# Patient Record
Sex: Male | Born: 1960 | Race: White | Hispanic: No | Marital: Married | Smoking: Never smoker
Health system: Southern US, Community
[De-identification: ages and names within clinical notes are randomized; demographics above are authoritative.]

## PROBLEM LIST (undated history)

## (undated) DIAGNOSIS — I10 Essential (primary) hypertension: Secondary | ICD-10-CM

## (undated) DIAGNOSIS — K922 Gastrointestinal hemorrhage, unspecified: Secondary | ICD-10-CM

## (undated) DIAGNOSIS — E800 Hereditary erythropoietic porphyria: Secondary | ICD-10-CM

## (undated) DIAGNOSIS — R21 Rash and other nonspecific skin eruption: Secondary | ICD-10-CM

## (undated) DIAGNOSIS — Z9289 Personal history of other medical treatment: Secondary | ICD-10-CM

## (undated) HISTORY — DX: Rash and other nonspecific skin eruption: R21

## (undated) HISTORY — DX: Hereditary erythropoietic porphyria: E80.0

## (undated) HISTORY — DX: Personal history of other medical treatment: Z92.89

## (undated) HISTORY — PX: OTHER SURGICAL HISTORY: SHX169

## (undated) HISTORY — DX: Gastrointestinal hemorrhage, unspecified: K92.2

## (undated) HISTORY — DX: Essential (primary) hypertension: I10

---

## 1998-11-03 DIAGNOSIS — E785 Hyperlipidemia, unspecified: Secondary | ICD-10-CM | POA: Insufficient documentation

## 2000-04-03 HISTORY — PX: VASECTOMY: SHX75

## 2001-01-01 DIAGNOSIS — D72819 Decreased white blood cell count, unspecified: Secondary | ICD-10-CM | POA: Insufficient documentation

## 2002-11-16 DIAGNOSIS — I10 Essential (primary) hypertension: Secondary | ICD-10-CM

## 2004-03-03 DIAGNOSIS — Z9289 Personal history of other medical treatment: Secondary | ICD-10-CM

## 2004-03-03 HISTORY — DX: Personal history of other medical treatment: Z92.89

## 2005-01-09 ENCOUNTER — Ambulatory Visit: Payer: Self-pay | Admitting: Family Medicine

## 2005-02-19 ENCOUNTER — Ambulatory Visit: Payer: Self-pay | Admitting: Unknown Physician Specialty

## 2005-06-20 ENCOUNTER — Ambulatory Visit: Payer: Self-pay | Admitting: Family Medicine

## 2005-06-24 ENCOUNTER — Ambulatory Visit: Payer: Self-pay | Admitting: Family Medicine

## 2007-01-20 ENCOUNTER — Ambulatory Visit: Payer: Self-pay | Admitting: Family Medicine

## 2007-01-20 LAB — CONVERTED CEMR LAB
ALT: 34 units/L (ref 0–40)
AST: 35 units/L (ref 0–37)
Albumin: 3.9 g/dL (ref 3.5–5.2)
Alkaline Phosphatase: 44 units/L (ref 39–117)
BUN: 22 mg/dL (ref 6–23)
Basophils Absolute: 0.1 10*3/uL (ref 0.0–0.1)
Basophils Relative: 1 % (ref 0.0–1.0)
Bilirubin, Direct: 0.1 mg/dL (ref 0.0–0.3)
CO2: 30 meq/L (ref 19–32)
Calcium: 9.1 mg/dL (ref 8.4–10.5)
Chloride: 107 meq/L (ref 96–112)
Cholesterol: 139 mg/dL (ref 0–200)
Creatinine, Ser: 1.1 mg/dL (ref 0.4–1.5)
Eosinophils Absolute: 0.3 10*3/uL (ref 0.0–0.6)
Eosinophils Relative: 5.4 % — ABNORMAL HIGH (ref 0.0–5.0)
GFR calc Af Amer: 94 mL/min
GFR calc non Af Amer: 78 mL/min
Glucose, Bld: 92 mg/dL (ref 70–99)
HCT: 40.3 % (ref 39.0–52.0)
HDL: 39.3 mg/dL (ref >39.0)
Hemoglobin: 13.8 g/dL (ref 13.0–17.0)
LDL Cholesterol: 88 mg/dL (ref 0–99)
Lymphocytes Relative: 30 % (ref 12.0–46.0)
MCHC: 34.2 g/dL (ref 30.0–36.0)
MCV: 84.3 fL (ref 78.0–100.0)
Monocytes Absolute: 0.4 10*3/uL (ref 0.2–0.7)
Monocytes Relative: 8.1 % (ref 3.0–11.0)
Neutro Abs: 2.8 10*3/uL (ref 1.4–7.7)
Neutrophils Relative %: 55.5 % (ref 43.0–77.0)
Platelets: 187 10*3/uL (ref 150–400)
Potassium: 4.2 meq/L (ref 3.5–5.1)
RBC: 4.78 M/uL (ref 4.22–5.81)
RDW: 14.1 % (ref 11.5–14.6)
Sodium: 142 meq/L (ref 135–145)
TSH: 2.07 microintl units/mL (ref 0.35–5.50)
Total Bilirubin: 0.7 mg/dL (ref 0.3–1.2)
Total CHOL/HDL Ratio: 3.5
Total Protein: 6.8 g/dL (ref 6.0–8.3)
Triglycerides: 57 mg/dL (ref 0–149)
VLDL: 11 mg/dL (ref 0–40)
WBC: 5.1 10*3/uL (ref 4.5–10.5)

## 2007-01-29 ENCOUNTER — Ambulatory Visit: Payer: Self-pay | Admitting: Family Medicine

## 2007-03-04 ENCOUNTER — Telehealth: Payer: Self-pay | Admitting: Family Medicine

## 2007-03-10 ENCOUNTER — Telehealth: Payer: Self-pay | Admitting: Family Medicine

## 2007-05-13 ENCOUNTER — Encounter (INDEPENDENT_AMBULATORY_CARE_PROVIDER_SITE_OTHER): Payer: Self-pay | Admitting: *Deleted

## 2007-05-26 ENCOUNTER — Ambulatory Visit: Payer: Self-pay | Admitting: Family Medicine

## 2007-09-27 ENCOUNTER — Encounter (INDEPENDENT_AMBULATORY_CARE_PROVIDER_SITE_OTHER): Payer: Self-pay | Admitting: *Deleted

## 2007-10-25 ENCOUNTER — Ambulatory Visit: Payer: Self-pay | Admitting: Family Medicine

## 2008-02-28 ENCOUNTER — Ambulatory Visit: Payer: Self-pay | Admitting: Family Medicine

## 2008-02-28 LAB — CONVERTED CEMR LAB
ALT: 17 units/L (ref 0–53)
AST: 21 units/L (ref 0–37)
Albumin: 3.6 g/dL (ref 3.5–5.2)
Alkaline Phosphatase: 41 units/L (ref 39–117)
BUN: 18 mg/dL (ref 6–23)
Basophils Absolute: 0 10*3/uL (ref 0.0–0.1)
Basophils Relative: 0.2 % (ref 0.0–1.0)
Bilirubin, Direct: 0.1 mg/dL (ref 0.0–0.3)
CO2: 30 meq/L (ref 19–32)
Calcium: 8.8 mg/dL (ref 8.4–10.5)
Chloride: 107 meq/L (ref 96–112)
Cholesterol: 118 mg/dL (ref 0–200)
Creatinine, Ser: 1 mg/dL (ref 0.4–1.5)
Eosinophils Absolute: 0.2 10*3/uL (ref 0.0–0.7)
Eosinophils Relative: 3.4 % (ref 0.0–5.0)
GFR calc Af Amer: 104 mL/min
GFR calc non Af Amer: 86 mL/min
Glucose, Bld: 90 mg/dL (ref 70–99)
HCT: 40.2 % (ref 39.0–52.0)
HDL: 32.8 mg/dL — ABNORMAL LOW (ref >39.0)
Hemoglobin: 13.3 g/dL (ref 13.0–17.0)
LDL Cholesterol: 73 mg/dL (ref 0–99)
Lymphocytes Relative: 27.1 % (ref 12.0–46.0)
MCHC: 33.1 g/dL (ref 30.0–36.0)
MCV: 85.9 fL (ref 78.0–100.0)
Monocytes Absolute: 0.4 10*3/uL (ref 0.1–1.0)
Monocytes Relative: 6.1 % (ref 3.0–12.0)
Neutro Abs: 3.9 10*3/uL (ref 1.4–7.7)
Neutrophils Relative %: 63.2 % (ref 43.0–77.0)
Platelets: 172 10*3/uL (ref 150–400)
Potassium: 4 meq/L (ref 3.5–5.1)
RBC: 4.68 M/uL (ref 4.22–5.81)
RDW: 14.3 % (ref 11.5–14.6)
Sodium: 141 meq/L (ref 135–145)
TSH: 1.82 microintl units/mL (ref 0.35–5.50)
Total Bilirubin: 0.8 mg/dL (ref 0.3–1.2)
Total CHOL/HDL Ratio: 3.6
Total Protein: 6.2 g/dL (ref 6.0–8.3)
Triglycerides: 61 mg/dL (ref 0–149)
VLDL: 12 mg/dL (ref 0–40)
WBC: 6.2 10*3/uL (ref 4.5–10.5)

## 2008-03-02 ENCOUNTER — Ambulatory Visit: Payer: Self-pay | Admitting: Family Medicine

## 2009-03-19 ENCOUNTER — Telehealth: Payer: Self-pay | Admitting: Family Medicine

## 2009-04-09 ENCOUNTER — Telehealth: Payer: Self-pay | Admitting: Family Medicine

## 2009-04-09 ENCOUNTER — Inpatient Hospital Stay: Payer: Self-pay | Admitting: Internal Medicine

## 2009-04-09 ENCOUNTER — Ambulatory Visit: Payer: Self-pay | Admitting: Family Medicine

## 2009-04-10 ENCOUNTER — Encounter: Payer: Self-pay | Admitting: Family Medicine

## 2009-04-10 LAB — HM COLONOSCOPY

## 2009-04-11 ENCOUNTER — Encounter: Payer: Self-pay | Admitting: Family Medicine

## 2009-04-17 DIAGNOSIS — D126 Benign neoplasm of colon, unspecified: Secondary | ICD-10-CM | POA: Insufficient documentation

## 2009-04-23 ENCOUNTER — Ambulatory Visit: Payer: Self-pay | Admitting: Family Medicine

## 2009-04-23 LAB — CONVERTED CEMR LAB
Basophils Absolute: 0.1 10*3/uL (ref 0.0–0.1)
Basophils Relative: 1.4 % (ref 0.0–3.0)
Eosinophils Absolute: 0.1 10*3/uL (ref 0.0–0.7)
Eosinophils Relative: 2.8 % (ref 0.0–5.0)
HCT: 35.3 % — ABNORMAL LOW (ref 39.0–52.0)
Hemoglobin: 12 g/dL — ABNORMAL LOW (ref 13.0–17.0)
Lymphocytes Relative: 20.8 % (ref 12.0–46.0)
Lymphs Abs: 1.1 10*3/uL (ref 0.7–4.0)
MCHC: 34.2 g/dL (ref 30.0–36.0)
MCV: 87.4 fL (ref 78.0–100.0)
Monocytes Absolute: 0.4 10*3/uL (ref 0.1–1.0)
Monocytes Relative: 8.2 % (ref 3.0–12.0)
Neutro Abs: 3.6 10*3/uL (ref 1.4–7.7)
Neutrophils Relative %: 66.8 % (ref 43.0–77.0)
Platelets: 205 10*3/uL (ref 150.0–400.0)
RBC: 4.04 M/uL — ABNORMAL LOW (ref 4.22–5.81)
RDW: 15.2 % — ABNORMAL HIGH (ref 11.5–14.6)
WBC: 5.3 10*3/uL (ref 4.5–10.5)

## 2009-07-12 ENCOUNTER — Encounter (INDEPENDENT_AMBULATORY_CARE_PROVIDER_SITE_OTHER): Payer: Self-pay | Admitting: *Deleted

## 2009-08-07 ENCOUNTER — Ambulatory Visit: Payer: Self-pay | Admitting: Family Medicine

## 2009-08-07 LAB — CONVERTED CEMR LAB
ALT: 18 units/L (ref 0–53)
AST: 25 units/L (ref 0–37)
Albumin: 4.3 g/dL (ref 3.5–5.2)
Alkaline Phosphatase: 45 units/L (ref 39–117)
BUN: 17 mg/dL (ref 6–23)
Basophils Absolute: 0.1 10*3/uL (ref 0.0–0.1)
Basophils Relative: 1.2 % (ref 0.0–3.0)
Bilirubin, Direct: 0 mg/dL (ref 0.0–0.3)
CO2: 29 meq/L (ref 19–32)
Calcium: 9 mg/dL (ref 8.4–10.5)
Chloride: 106 meq/L (ref 96–112)
Cholesterol: 144 mg/dL (ref 0–200)
Creatinine, Ser: 1.1 mg/dL (ref 0.4–1.5)
Eosinophils Absolute: 0.1 10*3/uL (ref 0.0–0.7)
Eosinophils Relative: 1.9 % (ref 0.0–5.0)
GFR calc non Af Amer: 76.62 mL/min (ref >60)
Glucose, Bld: 90 mg/dL (ref 70–99)
HCT: 40 % (ref 39.0–52.0)
HDL: 44.1 mg/dL (ref >39.00)
Hemoglobin: 13.8 g/dL (ref 13.0–17.0)
LDL Cholesterol: 90 mg/dL (ref 0–99)
Lymphocytes Relative: 24.6 % (ref 12.0–46.0)
Lymphs Abs: 1.3 10*3/uL (ref 0.7–4.0)
MCHC: 34.5 g/dL (ref 30.0–36.0)
MCV: 85.2 fL (ref 78.0–100.0)
Monocytes Absolute: 0.5 10*3/uL (ref 0.1–1.0)
Monocytes Relative: 9.5 % (ref 3.0–12.0)
Neutro Abs: 3.1 10*3/uL (ref 1.4–7.7)
Neutrophils Relative %: 62.8 % (ref 43.0–77.0)
Platelets: 157 10*3/uL (ref 150.0–400.0)
Potassium: 3.8 meq/L (ref 3.5–5.1)
RBC: 4.69 M/uL (ref 4.22–5.81)
RDW: 14.2 % (ref 11.5–14.6)
Sodium: 139 meq/L (ref 135–145)
TSH: 1.96 microintl units/mL (ref 0.35–5.50)
Total Bilirubin: 0.8 mg/dL (ref 0.3–1.2)
Total CHOL/HDL Ratio: 3
Total Protein: 7.2 g/dL (ref 6.0–8.3)
Triglycerides: 51 mg/dL (ref 0.0–149.0)
VLDL: 10.2 mg/dL (ref 0.0–40.0)
WBC: 5.1 10*3/uL (ref 4.5–10.5)

## 2009-08-09 ENCOUNTER — Ambulatory Visit: Payer: Self-pay | Admitting: Family Medicine

## 2009-08-09 DIAGNOSIS — J301 Allergic rhinitis due to pollen: Secondary | ICD-10-CM | POA: Insufficient documentation

## 2010-06-06 ENCOUNTER — Encounter (INDEPENDENT_AMBULATORY_CARE_PROVIDER_SITE_OTHER): Payer: Self-pay | Admitting: *Deleted

## 2010-09-19 ENCOUNTER — Ambulatory Visit: Payer: Self-pay | Admitting: Family Medicine

## 2010-10-02 ENCOUNTER — Ambulatory Visit: Payer: Self-pay | Admitting: Family Medicine

## 2010-11-21 ENCOUNTER — Ambulatory Visit
Admission: RE | Admit: 2010-11-21 | Discharge: 2010-11-21 | Payer: Self-pay | Source: Home / Self Care | Attending: Family Medicine | Admitting: Family Medicine

## 2010-11-21 ENCOUNTER — Other Ambulatory Visit: Payer: Self-pay | Admitting: Family Medicine

## 2010-11-21 LAB — BASIC METABOLIC PANEL
BUN: 16 mg/dL (ref 6–23)
CO2: 29 mEq/L (ref 19–32)
Calcium: 9.2 mg/dL (ref 8.4–10.5)
Chloride: 100 mEq/L (ref 96–112)
Creatinine, Ser: 1 mg/dL (ref 0.4–1.5)
GFR: 88.09 mL/min (ref >60.00)
Glucose, Bld: 90 mg/dL (ref 70–99)
Potassium: 3.6 mEq/L (ref 3.5–5.1)
Sodium: 135 mEq/L (ref 135–145)

## 2010-11-21 LAB — LIPID PANEL
Cholesterol: 160 mg/dL (ref 0–200)
HDL: 42.3 mg/dL (ref >39.00)
LDL Cholesterol: 102 mg/dL — ABNORMAL HIGH (ref 0–99)
Total CHOL/HDL Ratio: 4
Triglycerides: 78 mg/dL (ref 0.0–149.0)
VLDL: 15.6 mg/dL (ref 0.0–40.0)

## 2010-11-21 LAB — HEPATIC FUNCTION PANEL
ALT: 45 U/L (ref 0–53)
AST: 51 U/L — ABNORMAL HIGH (ref 0–37)
Albumin: 4.4 g/dL (ref 3.5–5.2)
Alkaline Phosphatase: 48 U/L (ref 39–117)
Bilirubin, Direct: 0 mg/dL (ref 0.0–0.3)
Total Bilirubin: 0.7 mg/dL (ref 0.3–1.2)
Total Protein: 7.2 g/dL (ref 6.0–8.3)

## 2010-11-21 LAB — CBC WITH DIFFERENTIAL/PLATELET
Basophils Absolute: 0 10*3/uL (ref 0.0–0.1)
Basophils Relative: 0.7 % (ref 0.0–3.0)
Eosinophils Absolute: 0.1 10*3/uL (ref 0.0–0.7)
Eosinophils Relative: 1.2 % (ref 0.0–5.0)
HCT: 40.8 % (ref 39.0–52.0)
Hemoglobin: 14 g/dL (ref 13.0–17.0)
Lymphocytes Relative: 22.1 % (ref 12.0–46.0)
Lymphs Abs: 1.2 10*3/uL (ref 0.7–4.0)
MCHC: 34.3 g/dL (ref 30.0–36.0)
MCV: 86.3 fl (ref 78.0–100.0)
Monocytes Absolute: 0.5 10*3/uL (ref 0.1–1.0)
Monocytes Relative: 9.3 % (ref 3.0–12.0)
Neutro Abs: 3.5 10*3/uL (ref 1.4–7.7)
Neutrophils Relative %: 66.7 % (ref 43.0–77.0)
Platelets: 171 10*3/uL (ref 150.0–400.0)
RBC: 4.73 Mil/uL (ref 4.22–5.81)
RDW: 15.1 % — ABNORMAL HIGH (ref 11.5–14.6)
WBC: 5.3 10*3/uL (ref 4.5–10.5)

## 2010-11-21 LAB — TSH: TSH: 2.36 u[IU]/mL (ref 0.35–5.50)

## 2010-11-28 ENCOUNTER — Ambulatory Visit
Admission: RE | Admit: 2010-11-28 | Discharge: 2010-11-28 | Payer: Self-pay | Source: Home / Self Care | Attending: Family Medicine | Admitting: Family Medicine

## 2010-12-04 ENCOUNTER — Ambulatory Visit: Admit: 2010-12-04 | Payer: Self-pay | Admitting: Family Medicine

## 2010-12-04 ENCOUNTER — Ambulatory Visit: Payer: Self-pay | Admitting: Family Medicine

## 2010-12-04 NOTE — Letter (Signed)
Summary: Nadara Eaton letter  [place] at [facility]  [address] [place], [place] [address]   Phone: [phone]  Fax: [phone]       06/06/2010 MRN: [id number]  TYLIQUE AULL [address] [place], [place]  [address]  Dear Mr. Sus[facility] [place] and [place] announce the retirement of Arta Silence, M.D., from full-time practice at the [facility] office effective May 02, 2010 and his plans of returning part-time.  It is important to Dr. Hetty Ely and to our practice that you understand that [facility] has seven physicians in our office for your health care needs.  We will continue to offer the same exceptional care that you have today.    Dr. Hetty Ely has spoken to many of you about his plans for retirement and returning part-time in the fall.   We will continue to work with you through the transition to schedule appointments for you in the office and meet the high standards that [place] is committed to.   Again, it is with great pleasure that we share the news that Dr. Hetty Ely will return to [facility] in October of 2011 with a reduced schedule.    If you have any questions, or would like to request an appointment with one of our physicians, please call us at [phone] and press the option for Scheduling an appointment.  We take pleasure in providing you with excellent patient care and look forward to seeing you at your next office visit.  Our [facility] Physicians are:  Tillman Abide, M.D. Laurita Quint, M.D. Roxy Manns, M.D. Kerby Nora, M.D. Hannah Beat, M.D. Ruthe Mannan, M.D. We proudly welcomed Raechel Ache, M.D. and Eustaquio Boyden, M.D. to the practice in July/August 2011.  Sincerely,  [place] [facility]

## 2010-12-04 NOTE — Assessment & Plan Note (Signed)
Summary: ROA FOR 2 WEEK FOLLOW-UP/JRR   Vital Signs:  Patient profile:   50 year old male Height:      70 inches Weight:      186 pounds BMI:     26.78 Temp:     96.7 degrees F oral Pulse rate:   76 / minute Pulse rhythm:   regular BP sitting:   132 / 90  (left arm) Cuff size:   regular  Vitals Entered By: Linde Gillis CMA Duncan Dull) (October 02, 2010 9:01 AM) CC: two week follow up   History of Present Illness: Doing well with slow uptitration on Amlodipine. He is now taking 2.5 daily. He has occas lightheadedness which he has had since before starting Amlodipine. He feels well and has no complaints. Is going to [facility] BB game tonite.  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 Apply To Area Max Once A Day, Once A Week If Poss. 2)  Metoprolol Succinate 100 Mg  Tb24 (Metoprolol Succinate) .Marland Kitchen.. 1 Tablet Daily By Mouth 3)  Clinical Study// Something Inserted in Stomach .... Done Friday// 4)  Flonase 50 Mcg/act Susp (Fluticasone Propionate) .... Use 2 Sprays in Each Notril Daily 5)  Amlodipine Besylate 2.5 Mg Tabs (Amlodipine Besylate) .... As Dir  Current Medications (verified): 1)  Clobetasol Propionate 0.05 %  Crea (Clobetasol Propionate) .... Apply To Area Max Once A Day, Once A Week If Poss. 2)  Flonase 50 Mcg/act Susp (Fluticasone Propionate) .... Use 2 Sprays in Each Notril Daily 3)  Amlodipine Besylate 2.5 Mg Tabs (Amlodipine Besylate) .... One Tab By Mouth Once Daily 4)  Amlodipine Besylate 5 Mg Tabs (Amlodipine  Besylate) .... One Tab By Mouth Daily.  Allergies (verified): No Known Drug Allergies  Physical Exam  General:  Well-developed,well-nourished,in no acute distress; alert,appropriate and cooperative throughout examination Head:  Normocephalic and atraumatic without obvious abnormalities. No apparent alopecia but mils male pattern  balding. Eyes:  Conjunctiva clear bilaterally. Minimal vertical nystagmus with lying down. Ears:  External ear exam shows no significant lesions or deformities.  Otoscopic examination reveals clear canals, tympanic membranes are intact bilaterally without bulging, retraction, inflammation or discharge. Hearing is grossly normal bilaterally. Nose:  External nasal examination shows no deformity or inflammation. Nasal mucosa are pink and moist without lesions or exudates. Mouth:  Oral mucosa and oropharynx without lesions or exudates.  Teeth in good repair. Neck:  No deformities, masses, or tenderness noted. Lungs:  Normal respiratory effort, chest expands symmetrically. Lungs are clear to auscultation, no crackles or wheezes. Heart:  Normal rate and regular rhythm. S1 and S2 normal without gallop, murmur, click, rub or other extra sounds.   Impression & Recommendations:  Problem # 1:  HYPERTENSION (ICD-401.9) Assessment Deteriorated Slightly high today but uptitrating. Cont slow titration weekly. Will get to 5mg  daily in approx 3 weeks. The following medications were removed from the medication list:    Metoprolol Succinate 100 Mg Tb24 (Metoprolol succinate) .Marland Kitchen... 1 tablet daily by mouth    Amlodipine Besylate 2.5 Mg Tabs (Amlodipine besylate) .Marland Kitchen... As dir His updated medication  list for this problem includes:    Amlodipine Besylate 2.5 Mg Tabs (Amlodipine besylate) ..... One tab by mouth once daily    Amlodipine Besylate 5 Mg Tabs (Amlodipine besylate) ..... One tab by mouth daily.  BP today: 132/90 Prior BP: 118/78 (09/19/2010)  Labs Reviewed: K+: 3.8  (08/07/2009) Creat: : 1.1 (08/07/2009)   Chol: 144 (08/07/2009)   HDL: 44.10 (08/07/2009)   LDL: 90 (08/07/2009)   TG: 51.0 (08/07/2009)  Complete Medication List: 1)  Clobetasol Propionate 0.05 % Crea (Clobetasol propionate) .... Apply to area max once a day, once a week if poss. 2)  Flonase 50 Mcg/act Susp (Fluticasone propionate) .... Use 2 sprays in each notril daily 3)  Amlodipine Besylate 2.5 Mg Tabs (Amlodipine besylate) .... One tab by mouth once daily 4)  Amlodipine Besylate 5 Mg Tabs (Amlodipine besylate) .... One tab by mouth daily.  Patient Instructions: 1)  RTC 9 weeks for BP check. 2)  Enjoy [employer] BB game tonite. Prescriptions: AMLODIPINE BESYLATE 5 MG TABS (AMLODIPINE BESYLATE) one tab by mouth daily.  #30 x 12   Entered and Authorized by:   Shaune Leeks MD   Signed by:   Shaune Leeks MD on 10/02/2010   Method used:   Print then Give to Patient   RxID:   [id number]    Orders Added: 1)  Est. Patient Level III [56213]     Current Allergies (reviewed today): No known allergies

## 2010-12-04 NOTE — Assessment & Plan Note (Signed)
Summary: DISCUSS MEDICATION/CLE   Vital Signs:  Patient profile:   50 year old male Weight:      186.50 pounds BMI:     26.86 Temp:     97.9 degrees F oral Pulse rate:   64 / minute Pulse rhythm:   regular BP sitting:   118 / 78  (left arm) Cuff size:   regular  Vitals Entered By: Sydell Axon LPN (September 19, 2010 3:12 PM) CC: Needs to take allergy shots and wants to discuss changing BP medication    History of Present Illness: Pt here for allergies. He is having bad allergy problems this Fall. He was tested via bloodwork for allergies and very allergic to cats and trees, grass, dustmites, etc. Dr Mikey Bussing  office (where he has been  seen and tested) is telling him he should not be on a beta blocker while getting allergy immunotherapy. He wants to start immun tx again (he had been on it years ago) and it helped then which he is hoping it will do now.   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 Spray 2 Spray As Directed Once A Day As Needed 2)  Clobetasol Propionate 0.05 %  Crea (Clobetasol Propionate) .... Apply To Area Max Once A Day, Once A Week If Poss. 3)  Metoprolol Succinate 100 Mg  Tb24 (Metoprolol Succinate) .Marland Kitchen.. 1 Tablet Daily By Mouth 4)  Clinical Study// Something Inserted in Stomach .... Done Friday// 5)  Anusol-Hc 25 Mg Supp (Hydrocortisone Acetate) .... One Supp Per Rectum Three Times A Day For One Week, Then As Needed.  Allergies: No Known Drug Allergies  Physical Exam  General:  Well-developed,well-nourished,in  no acute distress; alert,appropriate and cooperative throughout examination Head:  Normocephalic and atraumatic without obvious abnormalities. No apparent alopecia but mils male pattern  balding. Eyes:  Conjunctiva clear bilaterally. Minimal vertical nystagmus with lying down. Ears:  External ear exam shows no significant lesions or deformities.  Otoscopic examination reveals clear canals, tympanic membranes are intact bilaterally without bulging, retraction, inflammation or discharge. Hearing is grossly normal bilaterally. Nose:  External nasal examination shows no deformity or inflammation. Nasal mucosa are pink and moist without lesions or exudates. Mouth:  Oral mucosa and oropharynx without lesions or exudates.  Teeth in good repair. Neck:  No deformities, masses, or tenderness noted. Lungs:  Normal respiratory effort, chest expands symmetrically. Lungs are clear to auscultation, no crackles or wheezes. Heart:  Normal rate and regular rhythm. S1 and S2 normal without gallop, murmur, click, rub or other extra sounds.   Impression & Recommendations:  Problem # 1:  HYPERTENSION (ICD-401.9) Assessment Unchanged  Well controlled. He was tried on Norvasc when first started in BP medication 1/04 and changed to Toprol due to dizziness, which he had with the Toprol also but not as bad because he was tapered up more slowly. Will try Amlodipine again but at lower slower taper. Discussed gradual weaning off Metoprolol and gradual titration up with Amlodipine. His updated medication list for this problem includes:    Metoprolol Succinate 100 Mg Tb24 (Metoprolol succinate) .Marland KitchenMarland KitchenMarland KitchenMarland Kitchen  1 tablet daily by mouth    Amlodipine Besylate 2.5 Mg Tabs (Amlodipine besylate) .Marland Kitchen... As dir  BP today: 118/78 Prior BP: 114/78 (08/09/2009)  Labs Reviewed: K+: 3.8 (08/07/2009) Creat: : 1.1 (08/07/2009)   Chol: 144 (08/07/2009)   HDL: 44.10 (08/07/2009)   LDL: 90 (08/07/2009)   TG: 51.0 (08/07/2009)  Problem # 2:   ALLERGIC RHINITIS, SEASONAL (ICD-477.0) Assessment: Unchanged Starting allergy immunotherapy via Dr Jenne Campus in 2 weeks.  Complete Medication List: 1)  Clobetasol Propionate 0.05 % Crea (Clobetasol propionate) .... Apply to area max once a day, once a week if poss. 2)  Metoprolol Succinate 100 Mg Tb24 (Metoprolol succinate) .Marland Kitchen.. 1 tablet daily by mouth 3)  Clinical Study// Something Inserted in Stomach  .... Done friday// 4)  Flonase 50 Mcg/act Susp (Fluticasone propionate) .... Use 2 sprays in each notril daily 5)  Amlodipine Besylate 2.5 Mg Tabs (Amlodipine besylate) .... As dir  Patient Instructions: 1)  RTC 2 weeks for recheck. 2)  Pls sched pt for Comp Exam, labs prior when able. Prescriptions: AMLODIPINE BESYLATE 2.5 MG TABS (AMLODIPINE BESYLATE) as dir  #30 x 12   Entered and Authorized by:   Shaune Leeks MD   Signed by:   Shaune Leeks MD on 09/19/2010   Method used:   Electronically to        [facility]* (retail)       [address]       [place], [place]  [address]       Ph: [phone]       Fax: [phone]   RxID:   [id number]    Orders Added: 1)  Est. Patient Level III [03474]    Current Allergies (reviewed today): No known allergies

## 2010-12-05 NOTE — Assessment & Plan Note (Signed)
Summary: CPX/JRR   Vital Signs:  Patient profile:   50 year old male Weight:      181 pounds Temp:     97.8 degrees F oral Pulse rate:   67 / minute Pulse rhythm:   regular BP sitting:   125 / 80  (left arm) Cuff size:   large  Vitals Entered By: Mervin Hack CMA Duncan Dull) (November 28, 2010 8:37 AM) CC: adult physical   History of Present Illness: Pt here for Comp Exam. He is doing well and tolerating the Amlodipine well. He has not had bad dizziness since adjusting meds.  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 One Tab By Mouth Daily. 2)  Clobetasol Propionate 0.05 %  Crea (Clobetasol Propionate) .... Apply To Area Max Once A Day, Once A Week If Poss. 3)  Amlodipine Besylate 2.5 Mg Tabs (Amlodipine Besylate) .... One Tab By Mouth Once Daily 4)  Flonase 50 Mcg/act Susp (Fluticasone Propionate) .... Use 2 Sprays in Each Notril Daily  Allergies: No Known Drug Allergies  Past History:  Past Medical History: Last updated: 01/31/2007 Hypertension (11/16/2002)  Past Surgical History: Last updated: 08/09/2009 Chilton Si Tobacco Syndrome (dizziness, dehydration) Vasectomy Artis Flock) 04/03/2000 MRI head nml Jenne Campus) 03/2004 HOSP  [facility]) Anemia due to Blood Loss Hypokalemia Erythr Prophyria GI Bleed from Polyp 6/7-04/11/2009 Colonoscopy 4mm Polyp Transv  Polypectomy  Sm Int Hemms (Dr Mechele Collin) 04/10/2009    3-5 yrs  Family History: Last updated: 11/28/2010 Father A 1 Afib Mother A 46 Sister A 18 Sister A 48  Social History: Last updated: 03/02/2008 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 Eyes:  Denies blurring, discharge, and eye pain. ENT:  Denies decreased hearing, ear discharge, earache, and ringing in ears. CV:  Denies chest pain or discomfort, fainting, fatigue, palpitations, shortness of breath with exertion, swelling of feet, and swelling of hands. Resp:  Denies cough, shortness of breath, and wheezing. GI:  Denies abdominal pain, bloody stools, change in bowel habits, constipation, dark tarry stools, diarrhea, indigestion, loss of appetite, nausea, vomiting, vomiting blood, and yellowish skin color. GU:  Denies discharge, dysuria, nocturia, and urinary frequency. MS:  Denies joint pain, low back pain, muscle aches, cramps, and stiffness. Derm:  Denies dryness, itching, and rash. Neuro:  Denies numbness, poor balance, tingling, and tremors.  Physical Exam  General:  Well-developed,well-nourished,in no acute distress; alert,appropriate and cooperative throughout examination Head:  Normocephalic and atraumatic without obvious abnormalities. No apparent alopecia but mild  male pattern  balding at the crown. Eyes:  Conjunctiva clear bilaterally. Minimal vertical nystagmus with lying down. Ears:  External ear exam shows  no significant lesions or deformities.  Otoscopic examination reveals clear canals, tympanic membranes are intact bilaterally without bulging, retraction, inflammation or discharge. Hearing is grossly normal bilaterally. Nose:  External nasal examination shows no deformity or inflammation. Nasal mucosa are pink and moist without lesions or exudates. Mouth:  Oral mucosa and oropharynx without lesions or exudates.  Teeth in good repair. Neck:  No deformities, masses, or tenderness noted. Chest Wall:  No deformities, masses, tenderness or gynecomastia noted. Breasts:  No masses or gynecomastia noted Lungs:  Normal respiratory effort, chest expands symmetrically. Lungs are clear to auscultation, no crackles or wheezes. Heart:  Normal rate and regular rhythm. S1 and S2 normal without gallop, murmur, click, rub or other extra sounds. Abdomen:  Bowel sounds positive,abdomen soft and non-tender without masses, organomegaly or hernias noted. Rectal:  No external abnormalities noted. Normal sphincter tone. No rectal masses or tenderness. G neg. Genitalia:  Testes bilaterally descended without nodularity, tenderness or masses. No scrotal masses or lesions. No penis lesions or urethral discharge. Prostate:  Prostate gland firm and smooth, no enlargement, nodularity, tenderness, mass, asymmetry or induration. 10-20gms. Msk:  No deformity or scoliosis noted of thoracic or lumbar spine.   Pulses:  R and L carotid,radial,femoral,dorsalis pedis and posterior tibial pulses are full and equal bilaterally Extremities:  No clubbing, cyanosis, edema, or deformity noted with normal full range of motion of all joints.   Neurologic:  No cranial nerve deficits noted. Station and gait are normal. Sensory, motor and coordinative functions appear intact. Skin:  Intact without suspicious lesions or rashes, mild erythema of the scrotum. Cervical Nodes:  No lymphadenopathy noted Inguinal Nodes:  No significant adenopathy Psych:   Cognition and judgment appear intact. Alert and cooperative with normal attention span and concentration. No apparent delusions, illusions, hallucinations   Impression & Recommendations:  Problem # 1:  HEALTH MAINTENANCE EXAM (ICD-V70.0)  Reviewed preventive care protocols, scheduled due services, and updated immunizations. Tdap today.  Problem # 2:  ALLERGIC RHINITIS, SEASONAL (ICD-477.0) Assessment: Unchanged Now on allergy shots and doing well.  Problem # 3:  BENIGN PAROXYSMAL POSITIONAL VERTIGO (ICD-386.11) Assessment: Improved Essentially resolved.  Problem # 4:  TINEA CRURIS (ICD-110.3) Assessment: Unchanged Try changing Temovate to daily Eucerin.  Problem # 5:  LEUKOPENIA, CHRONIC (ICD-288.50) Assessment: Improved Normal today.  Problem # 6:  HYPERLIPIDEMIA, WITH LOW HDL (ICD-272.4) Assessment: Improved Cont curr diet and exercise. Labs Reviewed: SGOT: 51 (11/21/2010)   SGPT: 45 (11/21/2010)   HDL:42.30 (11/21/2010), 44.10 (08/07/2009)  LDL:102 (11/21/2010), 90 (04/54/0981)  Chol:160 (11/21/2010), 144 (08/07/2009)  Trig:78.0 (11/21/2010), 51.0 (08/07/2009)  Problem # 7:  HYPERTENSION (ICD-401.9) Assessment: Improved Now well controlled. Cont. The following medications were removed from the medication list:    Amlodipine Besylate 2.5 Mg Tabs (Amlodipine besylate) ..... One tab by mouth once daily His updated medication list for this problem includes:    Amlodipine Besylate 5 Mg Tabs (Amlodipine besylate) ..... One tab by mouth daily.  BP today: 125/80 Prior BP: 132/90 (10/02/2010)  Labs Reviewed: K+: 3.6 (11/21/2010) Creat: : 1.0 (11/21/2010)   Chol: 160 (11/21/2010)   HDL: 42.30 (11/21/2010)   LDL: 102 (11/21/2010)   TG: 78.0 (11/21/2010)  Complete Medication List: 1)  Amlodipine  Besylate 5 Mg Tabs (Amlodipine besylate) .... One tab by mouth daily. 2)  Clobetasol Propionate 0.05 % Crea (Clobetasol propionate) .... Apply to area max once a day, once a week if  poss. 3)  Flonase 50 Mcg/act Susp (Fluticasone propionate) .... Use 2 sprays in each notril daily  Patient Instructions: 1)  RTC as needed  2)  Tdap today.  3)  Try EUCERIN cream daily for rash.   Orders Added: 1)  Est. Patient 40-64 years [99396]    Current Allergies (reviewed today): No known allergies   Appended Document: CPX/JRR    Clinical Lists Changes  Orders: Added new Service order of Tdap => 33yrs IM (14782) - Signed Added new Service order of Admin 1st Vaccine (95621) - Signed Added new Service order of Admin 1st Vaccine [facility]) [id number]) - Signed Observations: Added new observation of TD BOOST VIS: 09/20/08 version given November 28, 2010. (11/28/2010 10:35) Added new observation of TD BOOSTERLO: [id number] (11/28/2010 10:35) Added new observation of TD BOOST EXP: 08/22/2012 (11/28/2010 10:35) Added new observation of TD BOOSTERBY: DeShannon Smith CMA (AAMA) (11/28/2010 10:35) Added new observation of TD BOOSTERRT: IM (11/28/2010 10:35) Added new observation of TDBOOSTERDSE: 0.5 ml (11/28/2010 10:35) Added new observation of TD BOOSTERMF: [employer] (11/28/2010 10:35) Added new observation of TD BOOST SIT: left deltoid (11/28/2010 10:35) Added new observation of TD BOOSTER: Tdap (11/28/2010 10:35)       Tetanus/Td Vaccine    Vaccine Type: Tdap    Site: left deltoid    Mfr: GlaxoSmithKline    Dose: 0.5 ml    Route: IM    Given by: Mervin Hack CMA (AAMA)    Exp. Date: 08/22/2012    Lot #: [id number]    VIS given: 09/20/08 version given November 28, 2010.

## 2011-10-22 ENCOUNTER — Other Ambulatory Visit: Payer: Self-pay | Admitting: Family Medicine

## 2011-11-06 ENCOUNTER — Encounter: Payer: Self-pay | Admitting: Family Medicine

## 2011-11-07 ENCOUNTER — Encounter: Payer: Self-pay | Admitting: Family Medicine

## 2011-11-07 ENCOUNTER — Ambulatory Visit (INDEPENDENT_AMBULATORY_CARE_PROVIDER_SITE_OTHER): Payer: BC Managed Care – PPO | Admitting: Family Medicine

## 2011-11-07 VITALS — BP 120/82 | HR 71 | Temp 97.6°F | Wt 186.0 lb

## 2011-11-07 DIAGNOSIS — Z23 Encounter for immunization: Secondary | ICD-10-CM

## 2011-11-07 DIAGNOSIS — D126 Benign neoplasm of colon, unspecified: Secondary | ICD-10-CM

## 2011-11-07 DIAGNOSIS — R21 Rash and other nonspecific skin eruption: Secondary | ICD-10-CM

## 2011-11-07 DIAGNOSIS — R42 Dizziness and giddiness: Secondary | ICD-10-CM

## 2011-11-07 DIAGNOSIS — I10 Essential (primary) hypertension: Secondary | ICD-10-CM

## 2011-11-07 MED ORDER — CLOBETASOL PROPIONATE 0.05 % EX CREA: TOPICAL_CREAM | CUTANEOUS | Status: DC

## 2011-11-07 MED ORDER — AMLODIPINE BESYLATE 5 MG PO TABS: 5.0000 mg | ORAL_TABLET | Freq: Every day | ORAL | Status: DC

## 2011-11-07 NOTE — Progress Notes (Signed)
Hypertension:    Using medication without problems or lightheadedness: yes Chest pain with exertion:no Edema:no Short of breath:no Average home BPs: not checked Due for labs.    Colonoscopy due 2015 per Dr. Mechele Collin.    Rash, controlled with prn clobetasol and otc creams. In gluteal crease. Longstanding.    On allergy shots per ENT.    H/o vertigo with prev ENT eval.  Worse laying down at night with certain head movements.  No othostatic sx.  Sx are usually brief.    Meds, vitals, and allergies reviewed.   PMH and SH reviewed  ROS: See HPI.  Otherwise negative.    GEN: nad, alert and oriented HEENT: mucous membranes moist, tm wnl NECK: supple w/o LA CV: rrr. PULM: ctab, no inc wob ABD: soft, +bs EXT: no edema SKIN: no acute rash except for faint area of irritation in the gluteal crease.   CN 2-12 wnl B, S/S/DTR wnl x4 but DHP positive

## 2011-11-07 NOTE — Patient Instructions (Addendum)
Keep exercising.  Come back for fasting labs.  You can get your results through our phone system.  Follow the instructions on the blue card. Take care.  Glad to see you.  I sent your meds to the pharmacy.

## 2011-11-09 DIAGNOSIS — R42 Dizziness and giddiness: Secondary | ICD-10-CM | POA: Insufficient documentation

## 2011-11-09 DIAGNOSIS — R21 Rash and other nonspecific skin eruption: Secondary | ICD-10-CM | POA: Insufficient documentation

## 2011-11-09 NOTE — Assessment & Plan Note (Signed)
Per Dr. Mechele Collin

## 2011-11-09 NOTE — Assessment & Plan Note (Signed)
Topical steroid caution, use med prn, and f/u prn.  He agrees.

## 2011-11-09 NOTE — Assessment & Plan Note (Signed)
Reproduced with DHP but normal exam o/w.  Longstanding.  D/w pt about extinction exercises and f/u prn.  He agrees.

## 2011-11-09 NOTE — Assessment & Plan Note (Signed)
Return for labs, no change in meds.

## 2011-11-11 ENCOUNTER — Other Ambulatory Visit (INDEPENDENT_AMBULATORY_CARE_PROVIDER_SITE_OTHER): Payer: BC Managed Care – PPO

## 2011-11-11 DIAGNOSIS — I10 Essential (primary) hypertension: Secondary | ICD-10-CM

## 2011-11-11 LAB — LIPID PANEL
Cholesterol: 163 mg/dL (ref 0–200)
Total CHOL/HDL Ratio: 3
Triglycerides: 58 mg/dL (ref 0.0–149.0)

## 2011-11-11 LAB — COMPREHENSIVE METABOLIC PANEL
ALT: 29 U/L (ref 0–53)
BUN: 18 mg/dL (ref 6–23)
CO2: 29 mEq/L (ref 19–32)
Calcium: 9.1 mg/dL (ref 8.4–10.5)
Chloride: 103 mEq/L (ref 96–112)
Creatinine, Ser: 1 mg/dL (ref 0.4–1.5)
GFR: 83.73 mL/min (ref >60.00)

## 2012-12-08 ENCOUNTER — Telehealth: Payer: Self-pay

## 2012-12-08 ENCOUNTER — Ambulatory Visit (INDEPENDENT_AMBULATORY_CARE_PROVIDER_SITE_OTHER): Payer: BC Managed Care – PPO | Admitting: Family Medicine

## 2012-12-08 ENCOUNTER — Encounter: Payer: Self-pay | Admitting: Family Medicine

## 2012-12-08 ENCOUNTER — Telehealth: Payer: Self-pay | Admitting: Family Medicine

## 2012-12-08 VITALS — BP 140/100 | HR 70 | Temp 98.3°F

## 2012-12-08 DIAGNOSIS — R509 Fever, unspecified: Secondary | ICD-10-CM | POA: Insufficient documentation

## 2012-12-08 NOTE — Telephone Encounter (Signed)
Patient Information:  Caller Name: Jorja Loa  Phone: [phone]  Patient: Douglas Mathews  Gender: Male  DOB: 1960/12/08  Age: 52 Years  PCP: Crawford Givens Clelia Croft) [facility])  Office Follow Up:  Does the office need to follow up with this patient?: No  Instructions For The Office: N/A  RN Note:  Per disposition ED now pt refused and request to be seen in the office today.  Contacted the office and spoke with Rena and once again advised pt to go to ED, pt refused.  Appt scheduled for 1600 today with Dr Para March per Artelia Laroche.  Advised pt to go to ED if symptoms worsen before his appt. time.  Pt verbalized understanding.  Symptoms  Reason For Call & Symptoms: Patient states he has fever,  chills,  bodyaches, cough, congestion. Pt reports a stiff without heache x1 week and has been taking ibuprofen every 6 hours without taking temperature x 3 days 12/06/12, last know temp 12/07/12 at 1600 102.3 oral. Temp now after 2 hours of motrin 100.7 oral.  Reviewed Health History In EMR: Yes  Reviewed Medications In EMR: Yes  Reviewed Allergies In EMR: Yes  Reviewed Surgeries / Procedures: Yes  Date of Onset of Symptoms: 12/06/2012  Treatments Tried: Ibuprofen 3- 200mg  tabs  Treatments Tried Worked: No  Guideline(s) Used:  Influenza - Seasonal  Disposition Per Guideline:   Go to ED Now  Reason For Disposition Reached:   Headache and stiff neck (can't touch chin to chest)  Advice Given:  N/A

## 2012-12-08 NOTE — Assessment & Plan Note (Signed)
He has a sore neck but not a true stiff neck (this is highly unlikely to be related to the fever).  No reason to suspect meningitis.  D/w pt.  Would treat that supportively with heat, is already improving.  Flu neg, likely nonflu viral process that should resolve with fluids, time, rest and antipyretics.  Fu prn.  Well appearing.   ddx d/w pt. He agrees.

## 2012-12-08 NOTE — Progress Notes (Signed)
Sx started just over 48 hours ago.  Fever Monday AM, up to 102.3.  Chills, aches, having to sleep under mult blankets.  Some dec in fever with motrin.  Last took tylenol at ~11 AM today.  No vomiting, no diarrhea.  No ST, no ear pain.   He had some antecedent neck tightness starting last week (also upper back tightness, R>L, likely related to exercise at the gym) and this is getting better. It predates the other sx by almost a week. This had happened prev and had prev self resolved.   Meds, vitals, and allergies reviewed.   ROS: See HPI.  Otherwise, noncontributory.  GEN: nad, alert and oriented HEENT: mucous membranes moist, tm wnl, nasal exam mildly stuffy, OP wnl NECK: supple w/o LA, normal ROM but he does have some paraspinal muscle tenderness R>L on the paracervical muscles and the medial portion of the shoulder girdle CV: rrr.  no murmur PULM: ctab, no inc wob ABD: soft, +bs EXT: no edema SKIN: no acute rash  Flu test neg

## 2012-12-08 NOTE — Telephone Encounter (Signed)
ED disposition is reasonable. He has been advised.

## 2012-12-08 NOTE — Patient Instructions (Addendum)
Take tylenol or ibuprofen, drink plenty of fluids and use a heating pad.  This should gradually improve.  Take care.

## 2012-12-08 NOTE — Telephone Encounter (Signed)
Douglas Mathews with [facility] said pt has stiff neck, cannot turn neck side to side, look up or touch chin to chest for 1 week; no h/a but fever 3 days; highest 12/07/12 was 102.3. Pt has chills, body aches,cough,congestion,feet hurt to walk and pt taking ibuprofen 600 mg q6h for 3 days. [facility] disposition is ED. Pt refuses to go to ED and wants to be seen in our office.Discussed with Blair,RN team lead and Advised Douglas Mathews pt should go to ED for evaluation. Pt still refuses and appt scheduled today 4 pm with Dr Para March. Douglas Mathews to advise pt if his condition changes or worsens pt must go to ED. Douglas Mathews voiced understanding and will advise pt.

## 2012-12-18 ENCOUNTER — Other Ambulatory Visit: Payer: Self-pay | Admitting: Family Medicine

## 2013-06-30 ENCOUNTER — Other Ambulatory Visit: Payer: Self-pay | Admitting: Family Medicine

## 2013-06-30 NOTE — Telephone Encounter (Signed)
Electronic refill request. Patient has not been seen since 11/2011 except for 1 acute visit.  Please advise.

## 2013-06-30 NOTE — Telephone Encounter (Signed)
Schedule lab visit ahead of BP check.  Thanks.  rx sent.

## 2013-07-01 NOTE — Telephone Encounter (Signed)
Patient advised. Transferred to [facility] to schedule CPE as requested per patient.

## 2013-08-25 ENCOUNTER — Other Ambulatory Visit: Payer: Self-pay | Admitting: Family Medicine

## 2013-08-25 DIAGNOSIS — E785 Hyperlipidemia, unspecified: Secondary | ICD-10-CM

## 2013-08-30 ENCOUNTER — Other Ambulatory Visit (INDEPENDENT_AMBULATORY_CARE_PROVIDER_SITE_OTHER): Payer: BC Managed Care – PPO

## 2013-08-30 DIAGNOSIS — E785 Hyperlipidemia, unspecified: Secondary | ICD-10-CM

## 2013-08-30 LAB — LIPID PANEL
HDL: 44.1 mg/dL (ref >39.00)
LDL Cholesterol: 90 mg/dL (ref 0–99)
Total CHOL/HDL Ratio: 3
VLDL: 14.8 mg/dL (ref 0.0–40.0)

## 2013-08-30 LAB — COMPREHENSIVE METABOLIC PANEL
ALT: 24 U/L (ref 0–53)
AST: 26 U/L (ref 0–37)
Calcium: 9.4 mg/dL (ref 8.4–10.5)
Chloride: 102 mEq/L (ref 96–112)
Creatinine, Ser: 1 mg/dL (ref 0.4–1.5)

## 2013-09-06 ENCOUNTER — Encounter: Payer: Self-pay | Admitting: Family Medicine

## 2013-09-06 ENCOUNTER — Ambulatory Visit (INDEPENDENT_AMBULATORY_CARE_PROVIDER_SITE_OTHER): Payer: BC Managed Care – PPO | Admitting: Family Medicine

## 2013-09-06 VITALS — BP 130/88 | HR 71 | Temp 97.8°F | Ht 70.0 in | Wt 177.8 lb

## 2013-09-06 DIAGNOSIS — Z23 Encounter for immunization: Secondary | ICD-10-CM

## 2013-09-06 DIAGNOSIS — R21 Rash and other nonspecific skin eruption: Secondary | ICD-10-CM

## 2013-09-06 DIAGNOSIS — I10 Essential (primary) hypertension: Secondary | ICD-10-CM

## 2013-09-06 DIAGNOSIS — Z0000 Encounter for general adult medical examination without abnormal findings: Secondary | ICD-10-CM

## 2013-09-06 MED ORDER — AMLODIPINE BESYLATE 5 MG PO TABS: ORAL_TABLET | ORAL | Status: DC

## 2013-09-06 MED ORDER — CLOBETASOL PROPIONATE 0.05 % EX CREA: TOPICAL_CREAM | CUTANEOUS | Status: DC

## 2013-09-06 NOTE — Assessment & Plan Note (Signed)
Routine anticipatory guidance given to patient.  See health maintenance. Tetanus 2012 Flu shot 2014 Prostate cancer screening and PSA options (with potential risks and benefits of testing vs not testing) were discussed along with recent recs/guidelines.  He declined testing PSA at this point. Colonoscopy 2014 per Dr. Mechele Collin.  Requesting records.   Diet and exercise.  Encouraged both.

## 2013-09-06 NOTE — Patient Instructions (Signed)
Take care.  Don't change your meds.  Recheck in about 1 year.  Glad to see you.

## 2013-09-06 NOTE — Assessment & Plan Note (Signed)
Continue prn topical steroid with cautions.  He agrees.  No sx currently.

## 2013-09-06 NOTE — Addendum Note (Signed)
Addended by: Annamarie Major on: 09/06/2013 10:32 AM   Modules accepted: Orders

## 2013-09-06 NOTE — Progress Notes (Signed)
CPE- See plan.  Routine anticipatory guidance given to patient.  See health maintenance. Tetanus 2012 Flu shot 2014 Prostate cancer screening and PSA options (with potential risks and benefits of testing vs not testing) were discussed along with recent recs/guidelines.  He declined testing PSA at this point. Colonoscopy 2014 per Dr. Mechele Collin.  Requesting records.   Diet and exercise.  Encouraged both.   Rash on buttock responding to clobetasol, needs refill, used periodically.    Hypertension:    Using medication without problems or lightheadedness: yes Chest pain with exertion:no Edema:no Short of breath:no Intentional weight loss  PMH and SH reviewed  Meds, vitals, and allergies reviewed.   ROS: See HPI.  Otherwise negative.    GEN: nad, alert and oriented HEENT: mucous membranes moist NECK: supple w/o LA CV: rrr. PULM: ctab, no inc wob ABD: soft, +bs EXT: no edema SKIN: no acute rash

## 2013-09-06 NOTE — Assessment & Plan Note (Signed)
Controlled, continue amlodipine.  Labs d/w pt along with diet and exercise.

## 2014-06-04 ENCOUNTER — Encounter: Payer: Self-pay | Admitting: Family Medicine

## 2014-06-07 DIAGNOSIS — K824 Cholesterolosis of gallbladder: Secondary | ICD-10-CM | POA: Insufficient documentation

## 2014-10-17 ENCOUNTER — Telehealth: Payer: Self-pay | Admitting: Family Medicine

## 2014-10-17 NOTE — Telephone Encounter (Signed)
Electronic refill request. Patient has not been seen in greater than 1 year with no upcoming appt scheduled.  According to refill protocol, please advise. Last Filled:     90 tablet 3RF on 09/06/2013

## 2014-10-17 NOTE — Telephone Encounter (Signed)
Sent, please schedule a CPE.  Thanks.   

## 2014-10-18 NOTE — Telephone Encounter (Signed)
Please call patient and schedule CPE.  

## 2014-10-18 NOTE — Telephone Encounter (Signed)
I spoke to patient and he scheduled lab on 11/09/14 and physical 11/16/14.

## 2014-11-05 ENCOUNTER — Other Ambulatory Visit: Payer: Self-pay | Admitting: Family Medicine

## 2014-11-05 DIAGNOSIS — I10 Essential (primary) hypertension: Secondary | ICD-10-CM

## 2014-11-09 ENCOUNTER — Other Ambulatory Visit (INDEPENDENT_AMBULATORY_CARE_PROVIDER_SITE_OTHER): Payer: BLUE CROSS/BLUE SHIELD

## 2014-11-09 DIAGNOSIS — I10 Essential (primary) hypertension: Secondary | ICD-10-CM

## 2014-11-09 LAB — BASIC METABOLIC PANEL
BUN: 15 mg/dL (ref 6–23)
CO2: 28 mEq/L (ref 19–32)
Calcium: 9.4 mg/dL (ref 8.4–10.5)
Chloride: 104 mEq/L (ref 96–112)
Creatinine, Ser: 1 mg/dL (ref 0.4–1.5)
GFR: 82.71 mL/min (ref >60.00)
Glucose, Bld: 90 mg/dL (ref 70–99)
POTASSIUM: 3.9 meq/L (ref 3.5–5.1)
SODIUM: 137 meq/L (ref 135–145)

## 2014-11-09 LAB — LIPID PANEL
CHOL/HDL RATIO: 4
CHOLESTEROL: 154 mg/dL (ref 0–200)
HDL: 43.2 mg/dL (ref >39.00)
LDL CALC: 96 mg/dL (ref 0–99)
NONHDL: 110.8
Triglycerides: 74 mg/dL (ref 0.0–149.0)
VLDL: 14.8 mg/dL (ref 0.0–40.0)

## 2014-11-16 ENCOUNTER — Encounter: Payer: Self-pay | Admitting: Family Medicine

## 2014-11-16 ENCOUNTER — Ambulatory Visit (INDEPENDENT_AMBULATORY_CARE_PROVIDER_SITE_OTHER): Payer: BLUE CROSS/BLUE SHIELD | Admitting: Family Medicine

## 2014-11-16 VITALS — BP 122/82 | HR 72 | Temp 98.4°F | Ht 70.0 in | Wt 184.8 lb

## 2014-11-16 DIAGNOSIS — Z23 Encounter for immunization: Secondary | ICD-10-CM

## 2014-11-16 DIAGNOSIS — Z7189 Other specified counseling: Secondary | ICD-10-CM

## 2014-11-16 DIAGNOSIS — I10 Essential (primary) hypertension: Secondary | ICD-10-CM

## 2014-11-16 DIAGNOSIS — Z0000 Encounter for general adult medical examination without abnormal findings: Secondary | ICD-10-CM

## 2014-11-16 MED ORDER — AMLODIPINE BESYLATE 5 MG PO TABS: 5.0000 mg | ORAL_TABLET | Freq: Every day | ORAL | Status: DC

## 2014-11-16 NOTE — Patient Instructions (Signed)
Take care. Glad to see you.  Try to get a little more exercise.  Recheck in about 1 year, sooner if needed.  Flu shot today.  Hep A/B shot today.  Call back about a nurse visit for 2nd Hep A/B shot in at least 1 month and and 3rd Hep A/B shot in at least 6 months.

## 2014-11-16 NOTE — Progress Notes (Signed)
Pre visit review using our clinic review tool, if applicable. No additional management support is needed unless otherwise documented below in the visit note.  CPE- See plan.  Routine anticipatory guidance given to patient.  See health maintenance. Tetanus 2012 Flu today.  PNA and shingles not due Colonoscopy 2014 Prostate cancer screening and PSA options (with potential risks and benefits of testing vs not testing) were discussed along with recent recs/guidelines.  He declined testing PSA at this point. Living will d/w pt.  Wife designated if patient were incapacitated. Diet and exercise.  D/w pt.  Doing well on both.    Hypertension:    Using medication without problems or lightheadedness: yes Chest pain with exertion:no Edema:no Short of breath:no  Liver clinic- Followed by [facility].  No jaundice, abd sx.  Never had had HAV/HBV vaccine.  Due.  D/w pt.   PMH and SH reviewed  Meds, vitals, and allergies reviewed.   ROS: See HPI.  Otherwise negative.    GEN: nad, alert and oriented HEENT: mucous membranes moist NECK: supple w/o LA CV: rrr. PULM: ctab, no inc wob ABD: soft, +bs EXT: no edema SKIN: no acute rash

## 2014-11-17 DIAGNOSIS — Z7189 Other specified counseling: Secondary | ICD-10-CM | POA: Insufficient documentation

## 2014-11-17 NOTE — Assessment & Plan Note (Signed)
Per [facility] but needs HAV/HBV vaccine, will start series today, d/w pt.

## 2014-11-17 NOTE — Assessment & Plan Note (Signed)
Routine anticipatory guidance given to patient. See health maintenance.  Tetanus 2012  Flu today.  PNA and shingles not due  Colonoscopy 2014  Prostate cancer screening and PSA options (with potential risks and benefits of testing vs not testing) were discussed along with recent recs/guidelines. He declined testing PSA at this point.  Living will d/w pt. Wife designated if patient were incapacitated.  Diet and exercise. D/w pt. Doing well on both.

## 2014-11-17 NOTE — Assessment & Plan Note (Signed)
Controlled, continue current med, labs d/w pt.  Continue work on diet and exercise.  He agrees.

## 2014-12-19 ENCOUNTER — Ambulatory Visit (INDEPENDENT_AMBULATORY_CARE_PROVIDER_SITE_OTHER): Payer: BLUE CROSS/BLUE SHIELD | Admitting: *Deleted

## 2014-12-19 DIAGNOSIS — Z23 Encounter for immunization: Secondary | ICD-10-CM

## 2015-04-03 ENCOUNTER — Telehealth: Payer: Self-pay

## 2015-04-03 MED ORDER — CIPROFLOXACIN HCL 500 MG PO TABS: 500.0000 mg | ORAL_TABLET | Freq: Two times a day (BID) | ORAL | Status: DC

## 2015-04-03 NOTE — Telephone Encounter (Signed)
Patient advised.

## 2015-04-03 NOTE — Telephone Encounter (Signed)
Sent. Thanks.   

## 2015-04-03 NOTE — Telephone Encounter (Signed)
Pt left v/m; pt leaving for [place] on 04/06/15 for one week; pt was recommended to contact PCP to get rx of Cipro incase has stomach issues while in [place]. Pt request cb when rx called in to [facility]. Last annual exam 11/16/14.

## 2015-05-17 ENCOUNTER — Ambulatory Visit (INDEPENDENT_AMBULATORY_CARE_PROVIDER_SITE_OTHER): Payer: BLUE CROSS/BLUE SHIELD

## 2015-05-17 DIAGNOSIS — Z23 Encounter for immunization: Secondary | ICD-10-CM | POA: Diagnosis not present

## 2015-06-12 DIAGNOSIS — E800 Hereditary erythropoietic porphyria: Secondary | ICD-10-CM | POA: Insufficient documentation

## 2015-11-12 ENCOUNTER — Other Ambulatory Visit: Payer: Self-pay | Admitting: Family Medicine

## 2015-11-12 DIAGNOSIS — I10 Essential (primary) hypertension: Secondary | ICD-10-CM

## 2015-11-15 ENCOUNTER — Other Ambulatory Visit (INDEPENDENT_AMBULATORY_CARE_PROVIDER_SITE_OTHER): Payer: BLUE CROSS/BLUE SHIELD

## 2015-11-15 DIAGNOSIS — I10 Essential (primary) hypertension: Secondary | ICD-10-CM | POA: Diagnosis not present

## 2015-11-15 LAB — LIPID PANEL
CHOL/HDL RATIO: 4
Cholesterol: 156 mg/dL (ref 0–200)
HDL: 44.3 mg/dL (ref >39.00)
LDL Cholesterol: 95 mg/dL (ref 0–99)
NonHDL: 111.86
Triglycerides: 84 mg/dL (ref 0.0–149.0)
VLDL: 16.8 mg/dL (ref 0.0–40.0)

## 2015-11-15 LAB — COMPREHENSIVE METABOLIC PANEL
ALT: 26 U/L (ref 0–53)
AST: 31 U/L (ref 0–37)
Albumin: 4.6 g/dL (ref 3.5–5.2)
Alkaline Phosphatase: 49 U/L (ref 39–117)
BUN: 14 mg/dL (ref 6–23)
CHLORIDE: 103 meq/L (ref 96–112)
CO2: 30 meq/L (ref 19–32)
Calcium: 9.9 mg/dL (ref 8.4–10.5)
Creatinine, Ser: 0.9 mg/dL (ref 0.40–1.50)
GFR: 94.11 mL/min (ref >60.00)
GLUCOSE: 93 mg/dL (ref 70–99)
POTASSIUM: 3.9 meq/L (ref 3.5–5.1)
SODIUM: 139 meq/L (ref 135–145)
TOTAL PROTEIN: 7.2 g/dL (ref 6.0–8.3)
Total Bilirubin: 0.5 mg/dL (ref 0.2–1.2)

## 2015-11-16 ENCOUNTER — Other Ambulatory Visit: Payer: BLUE CROSS/BLUE SHIELD

## 2015-11-20 ENCOUNTER — Encounter: Payer: Self-pay | Admitting: Family Medicine

## 2015-11-20 ENCOUNTER — Ambulatory Visit (INDEPENDENT_AMBULATORY_CARE_PROVIDER_SITE_OTHER): Payer: BLUE CROSS/BLUE SHIELD | Admitting: Family Medicine

## 2015-11-20 VITALS — BP 104/76 | HR 64 | Temp 97.8°F | Ht 70.0 in | Wt 180.8 lb

## 2015-11-20 DIAGNOSIS — Z125 Encounter for screening for malignant neoplasm of prostate: Secondary | ICD-10-CM | POA: Diagnosis not present

## 2015-11-20 DIAGNOSIS — Z23 Encounter for immunization: Secondary | ICD-10-CM | POA: Diagnosis not present

## 2015-11-20 DIAGNOSIS — R21 Rash and other nonspecific skin eruption: Secondary | ICD-10-CM

## 2015-11-20 DIAGNOSIS — Z119 Encounter for screening for infectious and parasitic diseases, unspecified: Secondary | ICD-10-CM

## 2015-11-20 DIAGNOSIS — I10 Essential (primary) hypertension: Secondary | ICD-10-CM

## 2015-11-20 DIAGNOSIS — Z0000 Encounter for general adult medical examination without abnormal findings: Secondary | ICD-10-CM

## 2015-11-20 DIAGNOSIS — R399 Unspecified symptoms and signs involving the genitourinary system: Secondary | ICD-10-CM

## 2015-11-20 LAB — PSA: PSA: 0.21 ng/mL (ref 0.10–4.00)

## 2015-11-20 MED ORDER — AMLODIPINE BESYLATE 5 MG PO TABS: 5.0000 mg | ORAL_TABLET | Freq: Every day | ORAL | Status: DC

## 2015-11-20 MED ORDER — CLOBETASOL PROPIONATE 0.05 % EX CREA: TOPICAL_CREAM | CUTANEOUS | Status: DC

## 2015-11-20 NOTE — Patient Instructions (Signed)
Go to the lab on the way out.  We'll contact you with your lab report. Take care.  Glad to see you.  

## 2015-11-20 NOTE — Progress Notes (Signed)
Pre visit review using our clinic review tool, if applicable. No additional management support is needed unless otherwise documented below in the visit note.  CPE- See plan.  Routine anticipatory guidance given to patient.  See health maintenance. Tetanus 2012 Flu today.  PNA and shingles not due Colonoscopy 2014 PSA screening d/w pt, re: pros and cons.  Urinary frequency.  Stream is still good, normal.  1-2x/night nocturia.  Not bothersome enough to treat but is reasonable to check PSA.  D/w pt.   Living will d/w pt. Wife designated if patient were incapacitated.  Diet and exercise. D/w pt. Doing pretty well on both.  HCV neg 05/24/14 at [facility].  Opts in for HIV screening.   Hypertension:  Using medication without problems or lightheadedness: yes Chest pain with exertion:no Edema:no Short of breath:no  Liver clinic- Followed by [id number]. No jaundice, abd sx. prev had HAV/HBV vaccine.  Needs refill on steroid for chronic rash.  Used episodically w/o ADE.    PMH and SH reviewed  Meds, vitals, and allergies reviewed.   ROS: See HPI.  Otherwise negative.    GEN: nad, alert and oriented HEENT: mucous membranes moist NECK: supple w/o LA CV: rrr. PULM: ctab, no inc wob ABD: soft, +bs EXT: no edema SKIN: no acute rash Prostate gland firm and smooth, no enlargement, nodularity, tenderness, mass, asymmetry or induration.

## 2015-11-21 DIAGNOSIS — R399 Unspecified symptoms and signs involving the genitourinary system: Secondary | ICD-10-CM | POA: Insufficient documentation

## 2015-11-21 LAB — HIV ANTIBODY (ROUTINE TESTING W REFLEX): HIV: NONREACTIVE

## 2015-11-21 NOTE — Assessment & Plan Note (Signed)
Continue prn steroid application.  No ADE.  Doing well.

## 2015-11-21 NOTE — Assessment & Plan Note (Signed)
Controlled, continue as is.  D/w pt about diet and exercise.  

## 2015-11-21 NOTE — Assessment & Plan Note (Signed)
Per [id number].  Will send labs to them as FYI.

## 2015-11-21 NOTE — Assessment & Plan Note (Signed)
Tetanus 2012  Flu today.  PNA and shingles not due  Colonoscopy 2014  PSA screening d/w pt, re: pros and cons. Urinary frequency. Stream is still good, normal. 1-2x/night nocturia. Not bothersome enough to treat but is reasonable to check PSA. D/w pt.  Living will d/w pt. Wife designated if patient were incapacitated.  Diet and exercise. D/w pt. Doing pretty well on both.  HCV neg 05/24/14 at [facility].  Opts in for HIV screening.

## 2015-11-21 NOTE — Assessment & Plan Note (Signed)
PSA screening d/w pt, re: pros and cons. Urinary frequency. Stream is still good, normal. 1-2x/night nocturia. Not bothersome enough to treat but is reasonable to check PSA. D/w pt.

## 2016-11-03 ENCOUNTER — Other Ambulatory Visit: Payer: Self-pay | Admitting: Family Medicine

## 2016-11-03 DIAGNOSIS — Z125 Encounter for screening for malignant neoplasm of prostate: Secondary | ICD-10-CM

## 2016-11-03 DIAGNOSIS — I10 Essential (primary) hypertension: Secondary | ICD-10-CM

## 2016-11-06 ENCOUNTER — Telehealth: Payer: Self-pay

## 2016-11-06 MED ORDER — PROMETHAZINE HCL 25 MG RE SUPP: 25.0000 mg | Freq: Four times a day (QID) | RECTAL | 0 refills | Status: DC | PRN

## 2016-11-06 NOTE — Telephone Encounter (Addendum)
Sent but if progressive sx, if dehydrated, not making urine etc, then needs ER eval.  Thanks.

## 2016-11-06 NOTE — Addendum Note (Signed)
Addended by: Tonia Ghent on: 11/06/2016 04:11 PM   Modules accepted: Orders

## 2016-11-06 NOTE — Telephone Encounter (Signed)
Please advise, thanks.

## 2016-11-06 NOTE — Telephone Encounter (Signed)
Pt said he urinated about 30 minutes ago, and he has a fever/chills of 101 since yesterday. He said he will try the suppository and if it doesn't help he will go to the ER. He also has an appt tomorrow.

## 2016-11-06 NOTE — Telephone Encounter (Signed)
Pt called states that he's been vomiting since 12am, he vomits every 30 minutes. He has a HA ans is very dehydrated.  He said he tired  Drinking pepsi but he can't keep anything down. He's requesting a rx for suppositoires he said this is the only thing that has helped him in the past.

## 2016-11-07 ENCOUNTER — Encounter: Payer: Self-pay | Admitting: Family Medicine

## 2016-11-07 ENCOUNTER — Ambulatory Visit (INDEPENDENT_AMBULATORY_CARE_PROVIDER_SITE_OTHER): Payer: Managed Care, Other (non HMO) | Admitting: Family Medicine

## 2016-11-07 VITALS — BP 110/72 | HR 99 | Temp 98.8°F | Wt 181.2 lb

## 2016-11-07 DIAGNOSIS — J111 Influenza due to unidentified influenza virus with other respiratory manifestations: Secondary | ICD-10-CM | POA: Diagnosis not present

## 2016-11-07 MED ORDER — OSELTAMIVIR PHOSPHATE 75 MG PO CAPS: 75.0000 mg | ORAL_CAPSULE | Freq: Two times a day (BID) | ORAL | 0 refills | Status: DC

## 2016-11-07 MED ORDER — AMLODIPINE BESYLATE 5 MG PO TABS: 5.0000 mg | ORAL_TABLET | Freq: Every day | ORAL | 3 refills | Status: DC

## 2016-11-07 NOTE — Assessment & Plan Note (Signed)
Presumed flu.  Rest and fluids.  Start tamiflu.  Skip amlodipine until eating and drinking normally.  D/w pt.  Okay for outpatient f/u.  He agrees.

## 2016-11-07 NOTE — Progress Notes (Signed)
Sx started about 2 days ago.  Started to feel unwell, felt chills.  Fatigued, felt like he had a fever.  Temp 101.5 the day before yesterday.  Temp/fever improved with ibuprofen.  Taking otc allergy/cold meds in the meantime.  Early yesterday AM felt sick on his stomach, vomited mult times yesterday.  Was able to get phenergan suppositories.  Was able to urinate yesterday.  Suppositories did help some, he was able to get rx yesterday.    Sick contact: brother in law with the flu, with recent contact.  Pt hasn't had a flu shot.    Patient has muscle aching.    He and his father have a hx of recurrent vomiting with illnesses.    At this point, last UOP was about 1 hour ago.  Not lightheaded but feels weak.  Still with cough, still with muscle aches.  Not SOB.  + flatus.    Meds, vitals, and allergies reviewed.   ROS: Per HPI unless specifically indicated in ROS section   GEN: nad, alert and oriented HEENT: mucous membranes moist, tm w/o erythema, nasal exam w/o erythema, clear discharge noted,  OP with cobblestoning NECK: supple w/o LA CV: rrr.   PULM: ctab, no inc wob ABD: soft, not ttp, normal BS EXT: no edema

## 2016-11-07 NOTE — Patient Instructions (Signed)
Presumed flu.  Rest and fluids.  Start tamiflu.  Skip amlodipine until you are eating and drinking normally.  Physical when possible.  Take care.  Glad to see you.

## 2016-11-13 ENCOUNTER — Other Ambulatory Visit: Payer: BLUE CROSS/BLUE SHIELD

## 2016-11-20 ENCOUNTER — Encounter: Payer: BLUE CROSS/BLUE SHIELD | Admitting: Family Medicine

## 2017-04-06 ENCOUNTER — Other Ambulatory Visit: Payer: 59

## 2017-04-08 ENCOUNTER — Other Ambulatory Visit: Payer: 59

## 2017-04-09 ENCOUNTER — Other Ambulatory Visit (INDEPENDENT_AMBULATORY_CARE_PROVIDER_SITE_OTHER): Payer: 59

## 2017-04-09 DIAGNOSIS — I10 Essential (primary) hypertension: Secondary | ICD-10-CM | POA: Diagnosis not present

## 2017-04-09 DIAGNOSIS — Z125 Encounter for screening for malignant neoplasm of prostate: Secondary | ICD-10-CM

## 2017-04-09 LAB — COMPREHENSIVE METABOLIC PANEL
ALK PHOS: 49 U/L (ref 39–117)
ALT: 22 U/L (ref 0–53)
AST: 25 U/L (ref 0–37)
Albumin: 4.6 g/dL (ref 3.5–5.2)
BILIRUBIN TOTAL: 0.5 mg/dL (ref 0.2–1.2)
BUN: 14 mg/dL (ref 6–23)
CO2: 29 meq/L (ref 19–32)
CREATININE: 1.03 mg/dL (ref 0.40–1.50)
Calcium: 9.7 mg/dL (ref 8.4–10.5)
Chloride: 104 mEq/L (ref 96–112)
GFR: 80.11 mL/min (ref >60.00)
GLUCOSE: 90 mg/dL (ref 70–99)
Potassium: 3.9 mEq/L (ref 3.5–5.1)
Sodium: 140 mEq/L (ref 135–145)
TOTAL PROTEIN: 7.2 g/dL (ref 6.0–8.3)

## 2017-04-09 LAB — PSA: PSA: 0.27 ng/mL (ref 0.10–4.00)

## 2017-04-09 LAB — LIPID PANEL
CHOL/HDL RATIO: 4
Cholesterol: 160 mg/dL (ref 0–200)
HDL: 43.9 mg/dL (ref >39.00)
LDL Cholesterol: 103 mg/dL — ABNORMAL HIGH (ref 0–99)
NONHDL: 115.78
Triglycerides: 66 mg/dL (ref 0.0–149.0)
VLDL: 13.2 mg/dL (ref 0.0–40.0)

## 2017-04-10 ENCOUNTER — Encounter: Payer: Self-pay | Admitting: Family Medicine

## 2017-04-10 ENCOUNTER — Ambulatory Visit (INDEPENDENT_AMBULATORY_CARE_PROVIDER_SITE_OTHER): Payer: 59 | Admitting: Family Medicine

## 2017-04-10 VITALS — BP 106/72 | HR 62 | Temp 97.9°F | Ht 70.0 in | Wt 180.5 lb

## 2017-04-10 DIAGNOSIS — I10 Essential (primary) hypertension: Secondary | ICD-10-CM

## 2017-04-10 DIAGNOSIS — Z0000 Encounter for general adult medical examination without abnormal findings: Secondary | ICD-10-CM | POA: Diagnosis not present

## 2017-04-10 MED ORDER — AMLODIPINE BESYLATE 5 MG PO TABS: 5.0000 mg | ORAL_TABLET | Freq: Every day | ORAL | 3 refills | Status: DC

## 2017-04-10 NOTE — Progress Notes (Signed)
CPE- See plan.  Routine anticipatory guidance given to patient.  See health maintenance.  The possibility exists that previously documented standard health maintenance information may have been brought forward from a previous encounter into this note.  If needed, that same information has been updated to reflect the current situation based on today's encounter.    Tetanus 2012  Flu encouraged.   PNA and shingles not due  Colonoscopy 2014  PSA wnl.  Prostate cancer screening and PSA options (with potential risks and benefits of testing vs not testing) were discussed along with recent recs/guidelines.  He elects to keep testing PSA yearly.   Living will d/w pt. Wife designated if patient were incapacitated.  Diet and exercise. D/w pt. HCV neg [facility]/24/14 at [facility].  HIV screening prev done.   Hypertension:    Using medication without problems or lightheadedness:  yes Chest pain with exertion:no Edema:no Short of breath:no Labs d/w pt. Lipids reasonable.  Porphyrin metabolism disorder d/w pt.  He can f/u with [facility].  He limits sun exposure in the meantime and that limits his itching/aches/swelling/cutaneous sx.    PMH and SH reviewed  Meds, vitals, and allergies reviewed.   ROS: Per HPI.  Unless specifically indicated otherwise in HPI, the patient denies:  General: fever. Eyes: acute vision changes ENT: sore throat Cardiovascular: chest pain Respiratory: SOB GI: vomiting GU: dysuria Musculoskeletal: acute back pain Derm: acute rash Neuro: acute motor dysfunction Psych: worsening mood Endocrine: polydipsia Heme: bleeding Allergy: hayfever  GEN: nad, alert and oriented HEENT: mucous membranes moist NECK: supple w/o LA CV: rrr. PULM: ctab, no inc wob ABD: soft, +bs EXT: no edema SKIN: no acute rash

## 2017-04-10 NOTE — Patient Instructions (Signed)
Take care.  Glad to see you.  Update me as needed.  Recheck next year.

## 2017-04-11 NOTE — Assessment & Plan Note (Signed)
Reasonable control. Continue as is. Update me as needed. Labs discussed with patient. He agrees.

## 2017-04-11 NOTE — Assessment & Plan Note (Signed)
Tetanus 2012  Flu encouraged.   PNA and shingles not due  Colonoscopy 2014  PSA wnl.  Prostate cancer screening and PSA options (with potential risks and benefits of testing vs not testing) were discussed along with recent recs/guidelines.  He elects to keep testing PSA yearly.   Living will d/w pt. Wife designated if patient were incapacitated.  Diet and exercise. D/w pt. HCV neg 05/24/14 at [facility].  HIV screening prev done.

## 2017-04-11 NOTE — Assessment & Plan Note (Signed)
He can call about follow-up at the [facility] clinic.

## 2017-09-22 ENCOUNTER — Ambulatory Visit: Payer: 59

## 2017-11-17 ENCOUNTER — Ambulatory Visit (INDEPENDENT_AMBULATORY_CARE_PROVIDER_SITE_OTHER): Payer: 59

## 2017-11-17 DIAGNOSIS — Z23 Encounter for immunization: Secondary | ICD-10-CM | POA: Diagnosis not present

## 2017-12-25 ENCOUNTER — Ambulatory Visit (INDEPENDENT_AMBULATORY_CARE_PROVIDER_SITE_OTHER): Payer: 59 | Admitting: Family Medicine

## 2017-12-25 ENCOUNTER — Encounter: Payer: Self-pay | Admitting: Family Medicine

## 2017-12-25 VITALS — BP 118/70 | HR 69 | Temp 97.9°F | Wt 174.5 lb

## 2017-12-25 DIAGNOSIS — K649 Unspecified hemorrhoids: Secondary | ICD-10-CM | POA: Insufficient documentation

## 2017-12-25 MED ORDER — HYDROCORTISONE 2.5 % RE CREA: 1.0000 "application " | TOPICAL_CREAM | Freq: Two times a day (BID) | RECTAL | 0 refills | Status: DC

## 2017-12-25 NOTE — Assessment & Plan Note (Signed)
Story/exam consistent with external hemorrhoid. Not acutely thrombosed. Anticipate resolution on its own as it is improving over last 24 hours - less pain. Will Rx anusol HC cream, sitz baths, further supportive care reviewed. Update if not improving with treatment. Pt agrees with plan.

## 2017-12-25 NOTE — Progress Notes (Signed)
   BP 118/70 (BP Location: Left Arm, Patient Position: Sitting, Cuff Size: Normal)   Pulse 69   Temp 97.9 F (36.6 C) (Oral)   Wt 174 lb 8 oz (79.2 kg)   SpO2 97%   BMI 25.04 kg/m    CC: rectal soreness Subjective:    Patient ID: Douglas Mathews, male    DOB: 08/19/1961, 57 y.o.   MRN: [id number]  HPI: Douglas Mathews is a 57 y.o. male presenting on 12/25/2017 for Rectal Pain (Noticed a painful blister in rectal area 2 days ago. Denies any bleeding.)   2d h/o soreness at back side - with noted bump. No bleeding. No pain with defecation. Actually today symptoms improved.  Has lost 10 lbs with healthy diet changes but increased bowel movements noted with this as well.   Relevant past medical, surgical, family and social history reviewed and updated as indicated.  Interim medical history since our last visit reviewed.  Allergies and medications reviewed and updated.  Outpatient Medications Prior to Visit  Medication Sig Dispense Refill  . amLODipine (NORVASC) 5 MG tablet Take 1 tablet (5 mg total) by mouth daily. 90 tablet 3  . clobetasol cream (TEMOVATE) 0.05 % Apply to area max once a day, once a week if possible 30 g 12  . fluticasone (FLONASE) 50 MCG/ACT nasal spray Place 2 sprays into the nose daily as needed.      No facility-administered medications prior to visit.      Per HPI unless specifically indicated in ROS section below Review of Systems     Objective:    BP 118/70 (BP Location: Left Arm, Patient Position: Sitting, Cuff Size: Normal)   Pulse 69   Temp 97.9 F (36.6 C) (Oral)   Wt 174 lb 8 oz (79.2 kg)   SpO2 97%   BMI 25.04 kg/m   Wt Readings from Last 3 Encounters:  12/25/17 174 lb 8 oz (79.2 kg)  04/10/17 180 lb 8 oz (81.9 kg)  11/07/16 181 lb 4 oz (82.2 kg)    Physical Exam  Constitutional: He appears well-developed and well-nourished. No distress.  Genitourinary: Rectal exam shows external hemorrhoid (enlarged R sided external hemorrhoid).    Nursing note and vitals reviewed.     Assessment & Plan:   Problem List Items Addressed This Visit    Acute hemorrhoid - Primary    Story/exam consistent with external hemorrhoid. Not acutely thrombosed. Anticipate resolution on its own as it is improving over last 24 hours - less pain. Will Rx anusol HC cream, sitz baths, further supportive care reviewed. Update if not improving with treatment. Pt agrees with plan.           Meds ordered this encounter  Medications  . hydrocortisone (ANUSOL-HC) 2.5 % rectal cream    Sig: Place 1 application rectally 2 (two) times daily.    Dispense:  30 g    Refill:  0   No orders of the defined types were placed in this encounter.   Follow up plan: Return if symptoms worsen or fail to improve.  Douglas Bush, MD

## 2017-12-25 NOTE — Patient Instructions (Signed)
You do have hemorrhoid Treat with anusol HC cream as needed and warm sitz baths.   Hemorrhoids Hemorrhoids are swollen veins in and around the rectum or anus. There are two types of hemorrhoids:  Internal hemorrhoids. These occur in the veins that are just inside the rectum. They may poke through to the outside and become irritated and painful.  External hemorrhoids. These occur in the veins that are outside of the anus and can be felt as a painful swelling or hard lump near the anus.  Most hemorrhoids do not cause serious problems, and they can be managed with home treatments such as diet and lifestyle changes. If home treatments do not help your symptoms, procedures can be done to shrink or remove the hemorrhoids. What are the causes? This condition is caused by increased pressure in the anal area. This pressure may result from various things, including:  Constipation.  Straining to have a bowel movement.  Diarrhea.  Pregnancy.  Obesity.  Sitting for long periods of time.  Heavy lifting or other activity that causes you to strain.  Anal sex.  What are the signs or symptoms? Symptoms of this condition include:  Pain.  Anal itching or irritation.  Rectal bleeding.  Leakage of stool (feces).  Anal swelling.  One or more lumps around the anus.  How is this diagnosed? This condition can often be diagnosed through a visual exam. Other exams or tests may also be done, such as:  Examination of the rectal area with a gloved hand (digital rectal exam).  Examination of the anal canal using a small tube (anoscope).  A blood test, if you have lost a significant amount of blood.  A test to look inside the colon (sigmoidoscopy or colonoscopy).  How is this treated? This condition can usually be treated at home. However, various procedures may be done if dietary changes, lifestyle changes, and other home treatments do not help your symptoms. These procedures can help make  the hemorrhoids smaller or remove them completely. Some of these procedures involve surgery, and others do not. Common procedures include:  Rubber band ligation. Rubber bands are placed at the base of the hemorrhoids to cut off the blood supply to them.  Sclerotherapy. Medicine is injected into the hemorrhoids to shrink them.  Infrared coagulation. A type of light energy is used to get rid of the hemorrhoids.  Hemorrhoidectomy surgery. The hemorrhoids are surgically removed, and the veins that supply them are tied off.  Stapled hemorrhoidopexy surgery. A circular stapling device is used to remove the hemorrhoids and use staples to cut off the blood supply to them.  Follow these instructions at home: Eating and drinking  Eat foods that have a lot of fiber in them, such as whole grains, beans, nuts, fruits, and vegetables. Ask your health care provider about taking products that have added fiber (fiber supplements).  Drink enough fluid to keep your urine clear or pale yellow. Managing pain and swelling  Take warm sitz baths for 20 minutes, 3-4 times a day to ease pain and discomfort.  If directed, apply ice to the affected area. Using ice packs between sitz baths may be helpful. ? Put ice in a plastic bag. ? Place a towel between your skin and the bag. ? Leave the ice on for 20 minutes, 2-3 times a day. General instructions  Take over-the-counter and prescription medicines only as told by your health care provider.  Use medicated creams or suppositories as told.  Exercise regularly.  Go to the bathroom when you have the urge to have a bowel movement. Do not wait.  Avoid straining to have bowel movements.  Keep the anal area dry and clean. Use wet toilet paper or moist towelettes after a bowel movement.  Do not sit on the toilet for long periods of time. This increases blood pooling and pain. Contact a health care provider if:  You have increasing pain and swelling that are  not controlled by treatment or medicine.  You have uncontrolled bleeding.  You have difficulty having a bowel movement, or you are unable to have a bowel movement.  You have pain or inflammation outside the area of the hemorrhoids. This information is not intended to replace advice given to you by your health care provider. Make sure you discuss any questions you have with your health care provider. Document Released: 10/17/2000 Document Revised: 03/19/2016 Document Reviewed: 07/04/2015 Elsevier Interactive Patient Education  [employer].

## 2018-01-19 LAB — HM COLONOSCOPY

## 2018-05-17 ENCOUNTER — Observation Stay (HOSPITAL_COMMUNITY): Payer: No Typology Code available for payment source

## 2018-05-17 ENCOUNTER — Observation Stay (HOSPITAL_COMMUNITY)
Admission: EM | Admit: 2018-05-17 | Discharge: 2018-05-18 | Disposition: A | Discharge status: Home / Self Care | Payer: No Typology Code available for payment source | Attending: Internal Medicine | Admitting: Internal Medicine

## 2018-05-17 ENCOUNTER — Other Ambulatory Visit: Payer: Self-pay

## 2018-05-17 ENCOUNTER — Emergency Department (HOSPITAL_COMMUNITY): Payer: No Typology Code available for payment source

## 2018-05-17 ENCOUNTER — Encounter (HOSPITAL_COMMUNITY): Payer: Self-pay | Admitting: Emergency Medicine

## 2018-05-17 DIAGNOSIS — R531 Weakness: Principal | ICD-10-CM

## 2018-05-17 DIAGNOSIS — H538 Other visual disturbances: Secondary | ICD-10-CM | POA: Diagnosis not present

## 2018-05-17 DIAGNOSIS — I10 Essential (primary) hypertension: Secondary | ICD-10-CM | POA: Diagnosis not present

## 2018-05-17 DIAGNOSIS — R29810 Facial weakness: Secondary | ICD-10-CM | POA: Diagnosis not present

## 2018-05-17 DIAGNOSIS — Z79899 Other long term (current) drug therapy: Secondary | ICD-10-CM | POA: Diagnosis not present

## 2018-05-17 DIAGNOSIS — R2990 Unspecified symptoms and signs involving the nervous system: Secondary | ICD-10-CM

## 2018-05-17 DIAGNOSIS — R4781 Slurred speech: Secondary | ICD-10-CM | POA: Diagnosis not present

## 2018-05-17 DIAGNOSIS — M21371 Foot drop, right foot: Secondary | ICD-10-CM

## 2018-05-17 DIAGNOSIS — I729 Aneurysm of unspecified site: Secondary | ICD-10-CM

## 2018-05-17 LAB — URINALYSIS, ROUTINE W REFLEX MICROSCOPIC
BILIRUBIN URINE: NEGATIVE
GLUCOSE, UA: NEGATIVE mg/dL
Hgb urine dipstick: NEGATIVE
Ketones, ur: NEGATIVE mg/dL
Leukocytes, UA: NEGATIVE
NITRITE: NEGATIVE
PH: 5 (ref 5.0–8.0)
Protein, ur: NEGATIVE mg/dL
Specific Gravity, Urine: 1.005 (ref 1.005–1.030)

## 2018-05-17 LAB — COMPREHENSIVE METABOLIC PANEL
ALT: 19 U/L (ref 0–44)
ANION GAP: 9 (ref 5–15)
AST: 28 U/L (ref 15–41)
Albumin: 4.3 g/dL (ref 3.5–5.0)
Alkaline Phosphatase: 48 U/L (ref 38–126)
BUN: 15 mg/dL (ref 6–20)
CALCIUM: 9.3 mg/dL (ref 8.9–10.3)
CHLORIDE: 104 mmol/L (ref 98–111)
CO2: 26 mmol/L (ref 22–32)
Creatinine, Ser: 1.03 mg/dL (ref 0.61–1.24)
GFR calc non Af Amer: >60 mL/min (ref >60)
Glucose, Bld: 110 mg/dL — ABNORMAL HIGH (ref 70–99)
POTASSIUM: 3.6 mmol/L (ref 3.5–5.1)
SODIUM: 139 mmol/L (ref 135–145)
Total Bilirubin: 0.5 mg/dL (ref 0.3–1.2)
Total Protein: 7.3 g/dL (ref 6.5–8.1)

## 2018-05-17 LAB — CBC
HCT: 39.1 % (ref 39.0–52.0)
HEMOGLOBIN: 13 g/dL (ref 13.0–17.0)
MCH: 28.3 pg (ref 26.0–34.0)
MCHC: 33.2 g/dL (ref 30.0–36.0)
MCV: 85 fL (ref 78.0–100.0)
Platelets: 266 10*3/uL (ref 150–400)
RBC: 4.6 MIL/uL (ref 4.22–5.81)
RDW: 13.8 % (ref 11.5–15.5)
WBC: 8 10*3/uL (ref 4.0–10.5)

## 2018-05-17 LAB — RAPID URINE DRUG SCREEN, HOSP PERFORMED
AMPHETAMINES: NOT DETECTED
BENZODIAZEPINES: NOT DETECTED
COCAINE: NOT DETECTED
OPIATES: NOT DETECTED
Tetrahydrocannabinol: NOT DETECTED

## 2018-05-17 LAB — ETHANOL

## 2018-05-17 LAB — DIFFERENTIAL
Abs Immature Granulocytes: 0 10*3/uL (ref 0.0–0.1)
BASOS ABS: 0.1 10*3/uL (ref 0.0–0.1)
BASOS PCT: 1 %
EOS PCT: 3 %
Eosinophils Absolute: 0.2 10*3/uL (ref 0.0–0.7)
IMMATURE GRANULOCYTES: 0 %
Lymphocytes Relative: 23 %
Lymphs Abs: 1.9 10*3/uL (ref 0.7–4.0)
MONO ABS: 0.6 10*3/uL (ref 0.1–1.0)
Monocytes Relative: 7 %
NEUTROS PCT: 66 %
Neutro Abs: 5.3 10*3/uL (ref 1.7–7.7)

## 2018-05-17 LAB — I-STAT CHEM 8, ED
BUN: 17 mg/dL (ref 6–20)
CHLORIDE: 102 mmol/L (ref 98–111)
CREATININE: 1 mg/dL (ref 0.61–1.24)
Calcium, Ion: 1.21 mmol/L (ref 1.15–1.40)
GLUCOSE: 108 mg/dL — AB (ref 70–99)
HEMATOCRIT: 39 % (ref 39.0–52.0)
HEMOGLOBIN: 13.3 g/dL (ref 13.0–17.0)
POTASSIUM: 3.7 mmol/L (ref 3.5–5.1)
Sodium: 139 mmol/L (ref 135–145)
TCO2: 27 mmol/L (ref 22–32)

## 2018-05-17 LAB — I-STAT TROPONIN, ED: Troponin i, poc: 0 ng/mL (ref 0.00–0.08)

## 2018-05-17 LAB — PROTIME-INR
INR: 0.95
Prothrombin Time: 12.6 seconds (ref 11.4–15.2)

## 2018-05-17 LAB — APTT: APTT: 29 s (ref 24–36)

## 2018-05-17 MED ORDER — ASPIRIN EC 325 MG PO TBEC
650.0000 mg | DELAYED_RELEASE_TABLET | Freq: Once | ORAL | Status: AC
  Administered 2018-05-17: 650 mg via ORAL
  Filled 2018-05-17: qty 2

## 2018-05-17 MED ORDER — ACETAMINOPHEN 325 MG PO TABS: 650.0000 mg | ORAL_TABLET | ORAL | Status: DC | PRN

## 2018-05-17 MED ORDER — ASPIRIN 325 MG PO TABS
325.0000 mg | ORAL_TABLET | Freq: Every day | ORAL | Status: DC
  Administered 2018-05-18: 325 mg via ORAL
  Filled 2018-05-17: qty 1

## 2018-05-17 MED ORDER — ACETAMINOPHEN 160 MG/5ML PO SOLN: 650.0000 mg | ORAL | Status: DC | PRN

## 2018-05-17 MED ORDER — STROKE: EARLY STAGES OF RECOVERY BOOK
Freq: Once | Status: AC
  Administered 2018-05-18: 01:00:00
  Filled 2018-05-17: qty 1

## 2018-05-17 MED ORDER — ASPIRIN 300 MG RE SUPP: 300.0000 mg | Freq: Every day | RECTAL | Status: DC

## 2018-05-17 MED ORDER — ENOXAPARIN SODIUM 40 MG/0.4ML ~~LOC~~ SOLN
40.0000 mg | SUBCUTANEOUS | Status: DC
  Administered 2018-05-18: 40 mg via SUBCUTANEOUS
  Filled 2018-05-17: qty 0.4

## 2018-05-17 MED ORDER — ACETAMINOPHEN 650 MG RE SUPP: 650.0000 mg | RECTAL | Status: DC | PRN

## 2018-05-17 MED ORDER — SODIUM CHLORIDE 0.9 % IV SOLN
INTRAVENOUS | Status: AC
  Administered 2018-05-18: 01:00:00 via INTRAVENOUS

## 2018-05-17 MED ORDER — SENNOSIDES-DOCUSATE SODIUM 8.6-50 MG PO TABS
1.0000 | ORAL_TABLET | Freq: Every evening | ORAL | Status: DC | PRN
Start: 2018-05-17 — End: 2018-05-18

## 2018-05-17 NOTE — ED Notes (Signed)
Patient transported to MRI 

## 2018-05-17 NOTE — H&P (Signed)
History and Physical    Douglas Mathews:[id number] DOB: 1961/03/05 DOA: 05/17/2018  PCP: Tonia Ghent, MD   Patient coming from: Home  Chief Complaint: Blurred vision, right-sided weakness   HPI: Douglas Mathews is a 57 y.o. male with medical history significant for hypertension, now presenting to the emergency department for evaluation of right leg weakness and transient blurred vision. Patient reports that he has been in his usual state of health and was having an uneventful day until around noon when he noted some subtle problems with his right leg, described as a "heaviness." He reports that his sandal on his right foot kept falling off. He had an episode of blurred vision and that seemed to resolve, but the right distal leg weakness persisted. He denies any chest pain or palpitations, denies headache, and denies ever experiencing this previously. No recent fevers, chills, or shortness of breath. Denies back pain.   ED Course: Upon arrival to the ED, patient is found to be afebrile, saturating well on room air, and with vitals otherwise stable.  EKG features a sinus rhythm with sinus arrhythmia and PVCs. Noncontrast head CT is a normal study. Chemistry panel and CBC are unremarkable. UDS is negative, ethanol undetectable, and urinalysis unremarkable. Neurology was consulted by the ED physician and recommended medical admission for further evaluation and management of possible CVA. Patient was treated with 650 mg of aspirin in the ED, remains hemodynamically stable, and will be observed for ongoing evaluation and management.   Review of Systems:  All other systems reviewed and apart from HPI, are negative.  Past Medical History:  Diagnosis Date  . GI bleed    due to bleeding polyp  . History of MRI 03/2004   head, nml [name])  . Hypertension   . Rash    on buttock, respods to clobetasol    Past Surgical History:  Procedure Laterality Date  . [facility])   06/07-06/07/2009   Anemia due to blood loss hypokalemia Erythr prophyria GI bleed from polyp  . VASECTOMY  04/03/00   [facility])     reports that he has never smoked. He has never used smokeless tobacco. He reports that he drinks alcohol. He reports that he does not use drugs.  No Known Allergies  Family History  Problem Relation Age of Onset  . Heart disease Father        Afib  . Prostate cancer Neg Hx   . Colon cancer Neg Hx      Prior to Admission medications   Medication Sig Start Date End Date Taking? Authorizing Provider  amLODipine (NORVASC) 5 MG tablet Take 1 tablet (5 mg total) by mouth daily. 04/10/17   Tonia Ghent, MD  clobetasol cream (TEMOVATE) 0.05 % Apply to area max once a day, once a week if possible 11/20/15   Tonia Ghent, MD  fluticasone [facility]) 50 MCG/ACT nasal spray Place 2 sprays into the nose daily as needed.     [provider]  hydrocortisone (ANUSOL-HC) 2.5 % rectal cream Place 1 application rectally 2 (two) times daily. 12/25/17   Ria Bush, MD    Physical Exam: Vitals:   05/17/18 2100 05/17/18 2115 05/17/18 2130 05/17/18 2145  BP: 128/85 127/86 (!) 139/101 (!) 137/92  Pulse: 79 82 (!) 112 91  Resp: 18 20 (!) 26 16  Temp:      TempSrc:      SpO2: 97% 99% 100% 98%      Constitutional: NAD, calm  Eyes: PERTLA, lids and conjunctivae normal ENMT: Mucous membranes are moist. Posterior pharynx clear of any exudate or lesions.   Neck: normal, supple, no masses, no thyromegaly Respiratory: clear to auscultation bilaterally, no wheezing, no crackles. Normal respiratory effort.   Cardiovascular: S1 & S2 heard, regular rate and rhythm. No extremity edema.   Abdomen: No distension, no tenderness, soft. Bowel sounds normal.  Musculoskeletal: no clubbing / cyanosis. No joint deformity upper and lower extremities.   Skin: no significant rashes, lesions, ulcers. Warm, dry, well-perfused. Neurologic: CN 2-12 grossly intact. Sensation to  light touch intact. Strength 5/5 in bilaterally upper extremities, and in proximal lower extremities. Strength 3/5 in distal RLE.  Psychiatric: Alert and oriented x 3. Pleasant and cooperative.     Labs on Admission: I have personally reviewed following labs and imaging studies  CBC: Recent Labs  Lab 05/17/18 1957 05/17/18 2017  WBC 8.0  --   NEUTROABS 5.3  --   HGB 13.0 13.3  HCT 39.1 39.0  MCV 85.0  --   PLT 266  --    Basic Metabolic Panel: Recent Labs  Lab 05/17/18 1957 05/17/18 2017  NA 139 139  K 3.6 3.7  CL 104 102  CO2 26  --   GLUCOSE 110* 108*  BUN 15 17  CREATININE 1.03 1.00  CALCIUM 9.3  --    GFR: CrCl cannot be calculated (Unknown ideal weight.). Liver Function Tests: Recent Labs  Lab 05/17/18 1957  AST 28  ALT 19  ALKPHOS 48  BILITOT 0.5  PROT 7.3  ALBUMIN 4.3   No results for input(s): LIPASE, AMYLASE in the last 168 hours. No results for input(s): AMMONIA in the last 168 hours. Coagulation Profile: Recent Labs  Lab 05/17/18 1957  INR 0.95   Cardiac Enzymes: No results for input(s): CKTOTAL, CKMB, CKMBINDEX, TROPONINI in the last 168 hours. BNP (last 3 results) No results for input(s): PROBNP in the last 8760 hours. HbA1C: No results for input(s): HGBA1C in the last 72 hours. CBG: No results for input(s): GLUCAP in the last 168 hours. Lipid Profile: No results for input(s): CHOL, HDL, LDLCALC, TRIG, CHOLHDL, LDLDIRECT in the last 72 hours. Thyroid Function Tests: No results for input(s): TSH, T4TOTAL, FREET4, T3FREE, THYROIDAB in the last 72 hours. Anemia Panel: No results for input(s): VITAMINB12, FOLATE, FERRITIN, TIBC, IRON, RETICCTPCT in the last 72 hours. Urine analysis:    Component Value Date/Time   COLORURINE STRAW (A) 05/17/2018 2031   APPEARANCEUR CLEAR 05/17/2018 2031   LABSPEC 1.005 05/17/2018 2031   PHURINE 5.0 05/17/2018 2031   GLUCOSEU NEGATIVE 05/17/2018 2031   HGBUR NEGATIVE 05/17/2018 2031   BILIRUBINUR  NEGATIVE 05/17/2018 2031   [place] NEGATIVE 05/17/2018 2031   PROTEINUR NEGATIVE 05/17/2018 2031   NITRITE NEGATIVE 05/17/2018 2031   LEUKOCYTESUR NEGATIVE 05/17/2018 2031   Sepsis Labs: @LABRCNTIP (procalcitonin:4,lacticidven:4) )No results found for this or any previous visit (from the past 240 hour(s)).   Radiological Exams on Admission: Ct Head Code Stroke Wo Contrast  Result Date: 05/17/2018 CLINICAL DATA:  Code stroke. RIGHT-sided facial droop with weakness. Last known normal 8 hours ago. EXAM: CT HEAD WITHOUT CONTRAST TECHNIQUE: Contiguous axial images were obtained from the base of the skull through the vertex without intravenous contrast. COMPARISON:  None. FINDINGS: Brain: No evidence of acute infarction, hemorrhage, hydrocephalus, extra-axial collection or mass lesion/mass effect. Vascular: No hyperdense vessel or unexpected calcification. Skull: Normal. Negative for fracture or focal lesion. Sinuses/Orbits: No acute finding. Other: None. ASPECTS [facility] Stroke  Program Early CT Score) - Ganglionic level infarction (caudate, lentiform nuclei, internal capsule, insula, M1-M3 cortex): 7 - Supraganglionic infarction (M4-M6 cortex): 3 Total score (0-10 with 10 being normal): 10 IMPRESSION: 1. Normal CT head. 2. ASPECTS is 10. These results were communicated to Dr. Cheral Marker at [place] 7/15/2019by text page via the [facility] messaging system. Electronically Signed   By: Staci Righter M.D.   On: 05/17/2018 20:13    EKG: Independently reviewed. Sinus rhythm with sinus arrhythmia, PVC's.   Assessment/Plan   1. Acute right-sided weakness  - Presents with weakness involving right face, RUE, and RLE as well as transient blurred vision  - Head CT is a normal study and basic ED workup is notable only for a sinus arrhythmia on EKG  - Neurology is consulting and much appreciated, recommending admission for further eval  - Continue cardiac monitoring, frequent neuro checks, PT/OT/SLP evals  - Check  MRI brain, MRA head, carotid dopplers, echo, fasting lipids, and A1c   2. Hypertension  - BP at goal  - Hold Norvasc while evaluating for possible acute ischemic CVA    DVT prophylaxis: Lovenox Code Status: Full  Family Communication: Family updated at bedside Consults called: neurology  Admission status: Observation     Vianne Bulls, MD [facility] Pager [phone]  If 7PM-7AM, please contact night-coverage [email or web address] Password [email or web address]  05/17/2018, 10:10 PM

## 2018-05-17 NOTE — ED Triage Notes (Signed)
Patient stated that he had flip flops on and noticed that one was not staying on foot.  He then stated was working at the computer and his vision became blurry.  He was having numbness in the right side and having trouble moving that leg.  Symptoms started around noon today.  Slight facial droop on right, slight weakness in right hand and right foot.

## 2018-05-17 NOTE — ED Provider Notes (Signed)
[facility] EMERGENCY DEPARTMENT Provider Note   CSN: [id number] Arrival date & time: 05/17/18  4315  An emergency department physician performed an initial assessment on this suspected stroke patient at 37.  History   Chief Complaint Chief Complaint  Patient presents with  . Stroke Symptoms   HPI  Patient is a 57 y.o. male with history of HTN presenting to the ED for waxing-and-waning right lower shotty weakness and difficulty with coordination. He states symptoms began today around 1200 when he noticed he was having difficulty keeping a shoe on his foot. At home in the evening, he also noticed difficulty focusing on his computer screen as well as worsening right lower show any weakness, prompting ED visit. No prior CVA or TIA. No headache or recent head injury. No recent chest pain, vomiting, or other illness.  Past Medical History:  Diagnosis Date  . GI bleed    due to bleeding polyp  . History of MRI 03/2004   head, nml [name])  . Hypertension   . Rash    on buttock, respods to clobetasol    Patient Active Problem List   Diagnosis Date Noted  . Acute right-sided weakness 05/17/2018  . Lower urinary tract symptoms (LUTS) 11/21/2015  . Advance care planning 11/17/2014  . 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 Routine general medical examination at a health care facility 09/06/2013  . Rash 11/09/2011  . Vertigo 11/09/2011  . ALLERGIC RHINITIS, SEASONAL 08/09/2009  . COLONIC POLYPS 04/17/2009  . Essential hypertension 11/16/2002  . LEUKOPENIA, CHRONIC 01/01/2001    Past Surgical History:  Procedure Laterality Date  . [facility])  06/07-06/07/2009   Anemia due to blood loss hypokalemia Erythr prophyria GI bleed from polyp  . VASECTOMY  04/03/00   [facility])        Home Medications    Prior to Admission medications   Medication Sig Start Date End Date Taking? Authorizing Provider  amLODipine (NORVASC) 5 MG tablet Take 1 tablet  (5 mg total) by mouth daily. 04/10/17  Yes Tonia Ghent, MD    Family History Family History  Problem Relation Age of Onset  . Heart disease Father        Afib  . Prostate cancer Neg Hx   . Colon cancer Neg Hx     Social History Social History   Tobacco Use  . Smoking status: Never Smoker  . Smokeless tobacco: Never Used  Substance Use Topics  . Alcohol use: Yes    Comment: 6 pack per week  . Drug use: No     Allergies   Patient has no known allergies.   Review of Systems Review of Systems  Constitutional: Negative for fever.  HENT: Negative for congestion.   Eyes: Positive for visual disturbance (resolved).  Respiratory: Negative for cough and shortness of breath.   Cardiovascular: Negative for chest pain.  Gastrointestinal: Negative for abdominal pain, diarrhea and vomiting.  Genitourinary: Negative for dysuria.  Musculoskeletal: Negative for back pain.  Skin: Negative for rash.  Neurological: Positive for weakness (RLE). Negative for headaches.  All other systems reviewed and are negative.    Physical Exam Updated Vital Signs BP 124/84   Pulse 85   Temp 98.6 F (37 C) (Oral)   Resp 19   SpO2 99%   Physical Exam  Constitutional: He is oriented to person, place, and time. No distress.  HENT:  Head: Normocephalic and atraumatic.  Mouth/Throat: Oropharynx is clear and moist.  Eyes:  Pupils are equal, round, and reactive to light.  Neck: Neck supple. No JVD present.  Cardiovascular: Normal rate, regular rhythm, normal heart sounds and intact distal pulses.  No murmur heard. Pulmonary/Chest: Breath sounds normal. No respiratory distress. He has no wheezes. He has no rales.  Abdominal: Soft. He exhibits no distension and no mass. There is no tenderness. There is no guarding.  Musculoskeletal: Normal range of motion. He exhibits no edema.  Neurological: He is alert and oriented to person, place, and time.  Speech is fluent. CN II through XII tested and  found to be intact. There is 4/5 strength throughout the RLE, most notably with dorsiflexion. Otherwise, 5/5 strength in extremities. No dysmetria on finger to nose.  Skin: Skin is warm and dry.  Psychiatric: He has a normal mood and affect. His behavior is normal.  Nursing note and vitals reviewed.    ED Treatments / Results  Labs (all labs ordered are listed, but only abnormal results are displayed) Labs Reviewed  COMPREHENSIVE METABOLIC PANEL - Abnormal; Notable for the following components:      Result Value   Glucose, Bld 110 (*)    All other components within normal limits  RAPID URINE DRUG SCREEN, HOSP PERFORMED - Abnormal; Notable for the following components:   Barbiturates   (*)    Value: Result not available. Reagent lot number recalled by manufacturer.   All other components within normal limits  URINALYSIS, ROUTINE W REFLEX MICROSCOPIC - Abnormal; Notable for the following components:   Color, Urine STRAW (*)    All other components within normal limits  I-STAT CHEM 8, ED - Abnormal; Notable for the following components:   Glucose, Bld 108 (*)    All other components within normal limits  ETHANOL  PROTIME-INR  APTT  CBC  DIFFERENTIAL  HIV ANTIBODY (ROUTINE TESTING)  HEMOGLOBIN A1C  LIPID PANEL  I-STAT TROPONIN, ED    EKG EKG Interpretation  Date/Time:  Monday May 17 2018 19:47:39 EDT Ventricular Rate:  87 PR Interval:  198 QRS Duration: 86 QT Interval:  360 QTC Calculation: 433 R Axis:   -10 Text Interpretation:  Sinus rhythm with sinus arrhythmia with occasional Premature ventricular complexes Cannot rule out Anterior infarct , age undetermined No previous tracing Confirmed by Blanchie Dessert [id number]) on 05/17/2018 8:24:35 PM   Radiology Mr Brain Wo Contrast  Result Date: 05/17/2018 CLINICAL DATA:  57 y/o M; right leg weakness and transient blurred vision. EXAM: MRI HEAD WITHOUT CONTRAST MRA HEAD WITHOUT CONTRAST TECHNIQUE: Multiplanar, multiecho  pulse sequences of the brain and surrounding structures were obtained without intravenous contrast. Angiographic images of the head were obtained using MRA technique without contrast. COMPARISON:  05/17/2018 CT head FINDINGS: MRI HEAD FINDINGS Brain: No acute infarction, hemorrhage, hydrocephalus, extra-axial collection or mass lesion. Few foci of T2 FLAIR hyperintensity in bifrontal subcortical white matter compatible with minimal chronic microvascular ischemic changes. 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 Skull and upper cervical spine: Normal marrow signal. Sinuses/Orbits: Negative. Other: None. MRA HEAD FINDINGS Internal carotid arteries:  Patent. Anterior cerebral arteries: Patent. Large right A1 and anterior communicating artery, small left A1, normal variant. Middle cerebral arteries: Patent. Anterior communicating artery: Patent. 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 Posterior communicating arteries:  Patent. Posterior cerebral arteries:  Patent.  Fetal left PCA. Basilar artery:  Patent. Vertebral arteries:  Patent. No evidence of high-grade stenosis or large vessel occlusion. IMPRESSION: MRI head:  1. No acute intracranial abnormality identified. 2. Minimal chronic microvascular ischemic changes of white matter. MRA head: 1. 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 2. No large vessel occlusion or high-grade stenosis. Electronically Signed   By: Kristine Garbe M.D.   On: 05/17/2018 23:41   Mr Jodene Nam Head Wo Contrast  Result Date: 05/17/2018 CLINICAL DATA:  57 y/o M; right leg weakness and transient blurred vision. EXAM: MRI HEAD WITHOUT CONTRAST MRA HEAD WITHOUT CONTRAST TECHNIQUE: Multiplanar, multiecho pulse sequences of the brain and surrounding structures were obtained without intravenous contrast. Angiographic images of the head were  obtained using MRA technique without contrast. COMPARISON:  05/17/2018 CT head FINDINGS: MRI HEAD FINDINGS Brain: No acute infarction, hemorrhage, hydrocephalus, extra-axial collection or mass lesion. Few foci of T2 FLAIR hyperintensity in bifrontal subcortical white matter compatible with minimal chronic microvascular ischemic changes. 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 Skull and upper cervical spine: Normal marrow signal. Sinuses/Orbits: Negative. Other: None. MRA HEAD FINDINGS Internal carotid arteries:  Patent. Anterior cerebral arteries: Patent. Large right A1 and anterior communicating artery, small left A1, normal variant. Middle cerebral arteries: Patent. Anterior communicating artery: Patent. 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 Posterior communicating arteries:  Patent. Posterior cerebral arteries:  Patent.  Fetal left PCA. Basilar artery:  Patent. Vertebral arteries:  Patent. No evidence of high-grade stenosis or large vessel occlusion. IMPRESSION: MRI head: 1. No acute intracranial abnormality identified. 2. Minimal chronic microvascular ischemic changes of white matter. MRA head: 1. 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 2. No large vessel occlusion or high-grade stenosis. Electronically Signed   By: Kristine Garbe M.D.   On: 05/17/2018 23:41   Ct Head Code Stroke Wo Contrast  Result Date: 05/17/2018 CLINICAL DATA:  Code stroke. RIGHT-sided facial droop with weakness. Last known normal 8 hours ago. EXAM: CT HEAD WITHOUT CONTRAST TECHNIQUE: Contiguous axial images were obtained from the base of the skull through the vertex without intravenous contrast. COMPARISON:  None. FINDINGS: Brain: No evidence of acute infarction, hemorrhage, hydrocephalus, extra-axial collection or mass lesion/mass effect. Vascular: No hyperdense vessel or  unexpected calcification. Skull: Normal. Negative for fracture or focal lesion. Sinuses/Orbits: No acute finding. Other: None. ASPECTS [facility] Stroke Program Early CT Score) - Ganglionic level infarction (caudate, lentiform nuclei, internal capsule, insula, M1-M3 cortex): 7 - Supraganglionic infarction (M4-M6 cortex): 3 Total score (0-10 with 10 being normal): 10 IMPRESSION: 1. Normal CT head. 2. ASPECTS is 10. These results were communicated to Dr. Cheral Marker at [place] 7/15/2019by text page via the [facility] messaging system. Electronically Signed   By: Staci Righter M.D.   On: 05/17/2018 20:13    Procedures Procedures (including critical care time)  Medications Ordered in ED Medications   stroke: mapping our early stages of recovery book (has no administration in time range)  0.9 %  sodium chloride infusion (has no administration in time range)  acetaminophen (TYLENOL) tablet 650 mg (has no administration in time range)    Or  acetaminophen (TYLENOL) solution 650 mg (has no administration in time range)    Or  acetaminophen (TYLENOL) suppository 650 mg (has no administration in time range)  senna-docusate (Senokot-S) tablet 1 tablet (has no administration in time range)  enoxaparin (LOVENOX) injection 40 mg (has no administration in time range)  aspirin suppository 300 mg (has no administration in time range)  Or  aspirin tablet 325 mg (has no administration in time range)  aspirin EC tablet 650 mg (650 mg Oral Given 05/17/18 2142)     Initial Impression / Assessment and Plan / ED Course  I have reviewed the triage vital signs and the nursing notes.  Pertinent labs & imaging results that were available during my care of the patient were reviewed by me and considered in my medical decision making (see chart for details).  This is a 57 year old presenting for RLE weakness and vision changes concerning for TIA versus CVA as above. Code stroke was activated during triage for which she was  evaluated by neurologist Dr. Cheral Marker. Stroke was deactivated. CT head shows no acute findings. Not felt to be a tPA candidate by neurology due to duration of symptoms. No recent back pain, falls, or other back injury to suggest cord etiology. No signs of infection. Screening labs unremarkable. Per neurology, patient loaded with 650 mg PO ASA and admitted to hospitalist for further medical optimization and evaluation of TIA versus CVA.  Final Clinical Impressions(s) / ED Diagnoses   Final diagnoses:  Stroke-like symptoms     Prescilla Sours, MD 05/17/18 2354    Blanchie Dessert, MD 05/18/18 [phone]

## 2018-05-17 NOTE — ED Provider Notes (Signed)
Patient placed in Quick Look pathway, seen and evaluated   Chief Complaint: right foot weakness  HPI:   57 year old male with a h/o of HTN presenting with right foot "heaviness". Last known normal 12:00 PM. He reports worsening heaviness to the right foot and lower leg. He felt like he was dragging his leg behind him while he was walking this afternoon. Reports his vision became blurred this afternoon at work. He reports associated nausea. Denies slurred speech or dizziness.    ROS: right foot and lower leg weakness (one)  Physical Exam:   Gen: No distress  Neuro: Awake and Alert  Skin: Warm    Focused Exam: Slight right-sided facial droop. Heel to toe is smooth and symmetric bilaterally. Patient has mild ataxia with heel walking. No focal weakness.   Code stroke initiated.    Initiation of care has begun. The patient has been counseled on the process, plan, and necessity for staying for the completion/evaluation, and the remainder of the medical screening examination    Joanne Gavel, PA-C 05/17/18 Ladell Heads, MD 05/18/18 [phone]

## 2018-05-18 ENCOUNTER — Encounter: Payer: Self-pay | Admitting: Neurology

## 2018-05-18 ENCOUNTER — Encounter (HOSPITAL_COMMUNITY): Payer: 59

## 2018-05-18 ENCOUNTER — Other Ambulatory Visit: Payer: Self-pay | Admitting: *Deleted

## 2018-05-18 ENCOUNTER — Other Ambulatory Visit (HOSPITAL_COMMUNITY): Payer: 59

## 2018-05-18 DIAGNOSIS — M21371 Foot drop, right foot: Secondary | ICD-10-CM

## 2018-05-18 DIAGNOSIS — R2990 Unspecified symptoms and signs involving the nervous system: Secondary | ICD-10-CM | POA: Diagnosis not present

## 2018-05-18 DIAGNOSIS — R531 Weakness: Secondary | ICD-10-CM | POA: Diagnosis not present

## 2018-05-18 DIAGNOSIS — I10 Essential (primary) hypertension: Secondary | ICD-10-CM | POA: Diagnosis not present

## 2018-05-18 LAB — HIV ANTIBODY (ROUTINE TESTING W REFLEX): HIV SCREEN 4TH GENERATION: NONREACTIVE

## 2018-05-18 LAB — LIPID PANEL
CHOL/HDL RATIO: 3.1 ratio
Cholesterol: 150 mg/dL (ref 0–200)
HDL: 48 mg/dL (ref >40)
LDL Cholesterol: 92 mg/dL (ref 0–99)
Triglycerides: 52 mg/dL (ref <150)
VLDL: 10 mg/dL (ref 0–40)

## 2018-05-18 NOTE — Care Management Note (Signed)
Case Management Note  Patient Details  Name: ABDUR HOGLUND MRN: [id number] Date of Birth: May 09, 1961  Subjective/Objective:                 Patient to DC to home. Spoke w Dr [place] Pink, no OP referral needed at this time. No CM needs.    Action/Plan:   Expected Discharge Date:  05/18/18               Expected Discharge Plan:  Home/Self Care  In-House Referral:     Discharge planning Services  CM Consult  Post Acute Care Choice:    Choice offered to:     DME Arranged:    DME Agency:     HH Arranged:    HH Agency:     Status of Service:  Completed, signed off  If discussed at [employer] of Stay Meetings, dates discussed:    Additional Comments:  Douglas Collet, RN 05/18/2018, 1:01 PM

## 2018-05-18 NOTE — Progress Notes (Unsigned)
emg 

## 2018-05-18 NOTE — Progress Notes (Signed)
Nsg Discharge Note  Admit Date:  05/17/2018 Discharge date: 05/18/2018   Douglas Mathews to be D/C'd Home per MD order.  AVS completed.  Copy for chart, and copy for patient signed, and dated. Patient/caregiver able to verbalize understanding.  Discharge Medication: Allergies as of 05/18/2018   No Known Allergies     Medication List    TAKE these medications   amLODipine 5 MG tablet Commonly known as:  NORVASC Take 1 tablet (5 mg total) by mouth daily.       Discharge Assessment: Vitals:   05/18/18 0735 05/18/18 1115  BP: 134/90 116/85  Pulse: 67 67  Resp: 18 18  Temp: 98.4 F (36.9 C) 98.2 F (36.8 C)  SpO2: 100% 100%   Skin clean, dry and intact without evidence of skin break down, no evidence of skin tears noted. IV catheter discontinued intact. Site without signs and symptoms of complications - no redness or edema noted at insertion site, patient denies c/o pain - only slight tenderness at site.  Dressing with slight pressure applied.  D/c Instructions-Education: Discharge instructions given to patient/family with verbalized understanding. D/c education completed with patient/family including follow up instructions, medication list, d/c activities limitations if indicated, with other d/c instructions as indicated by MD - patient able to verbalize understanding, all questions fully answered. Patient instructed to return to ED, call 911, or call MD for any changes in condition.  Patient escorted via [place], and D/C home via private auto.  Eda Keys, RN 05/18/2018 12:59 PM

## 2018-05-18 NOTE — Discharge Summary (Signed)
[facility]  Physician Discharge Summary   Patient ID: Douglas Mathews MRN: [id number] DOB/AGE: [birth year] 57 y.o.  Admit date: 05/17/2018 Discharge date: 05/18/2018  PCP: Tonia Ghent, MD  DISCHARGE DIAGNOSES:  Right foot drop  RECOMMENDATIONS FOR OUTPATIENT FOLLOW UP: 1. EMG/NCV study scheduled at [facility] neurology office 2. Appointment also scheduled with interventional neuroradiology for aneurysm as recommended by neurology 3. Patient declines outpatient physical therapy for now   DISCHARGE CONDITION: fair  Diet recommendation: As before  Filed Weights   05/18/18 0015  Weight: 75.7 kg (166 lb 14.2 oz)    INITIAL HISTORY: Douglas Mathews is a 57 y.o. male with medical history significant for hypertension, presented to the emergency department for evaluation of right leg weakness and transient blurred vision. He reported that his sandal on his right foot kept falling off.  Patient was hospitalized for further evaluation and management.  Consultations:  Neurology  Procedures:  None   HOSPITAL COURSE:   Right foot drop Initially concern was for stroke however MRI brain did not show any stroke.  Patient did not have any signs suggestive of acute CVA.  Patient underwent MRI brain,  MRA head and MRI lumbar spine.  No acute abnormalities were noted on the studies.  Neurology thinks patient has a right foot drop.  They recommend outpatient EMG/NCV.  This has been arranged.  Seen by physical therapy.  Outpatient PT was recommended but patient declines for now.  He will follow-up with his PCP.  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  Outpatient appointment with interventional neuroradiology has been arranged.  History of essential hypertension Continue home medications.  Stable for discharge home today.     PERTINENT LABS:  The results of significant diagnostics from  this hospitalization (including imaging, microbiology, ancillary and laboratory) are listed below for reference.     Labs: Basic Metabolic Panel: Recent Labs  Lab 05/17/18 1957 05/17/18 2017  NA 139 139  K 3.6 3.7  CL 104 102  CO2 26  --   GLUCOSE 110* 108*  BUN 15 17  CREATININE 1.03 1.00  CALCIUM 9.3  --    Liver Function Tests: Recent Labs  Lab 05/17/18 1957  AST 28  ALT 19  ALKPHOS 48  BILITOT 0.5  PROT 7.3  ALBUMIN 4.3   CBC: Recent Labs  Lab 05/17/18 1957 05/17/18 2017  WBC 8.0  --   NEUTROABS 5.3  --   HGB 13.0 13.3  HCT 39.1 39.0  MCV 85.0  --   PLT 266  --      IMAGING STUDIES Mr Brain Wo Contrast  Result Date: 05/17/2018 CLINICAL DATA:  57 y/o M; right leg weakness and transient blurred vision. EXAM: MRI HEAD WITHOUT CONTRAST MRA HEAD WITHOUT CONTRAST TECHNIQUE: Multiplanar, multiecho pulse sequences of the brain and surrounding structures were obtained without intravenous contrast. Angiographic images of the head were obtained using MRA technique without contrast. COMPARISON:  05/17/2018 CT head FINDINGS: MRI HEAD FINDINGS Brain: No acute infarction, hemorrhage, hydrocephalus, extra-axial collection or mass lesion. Few foci of T2 FLAIR hyperintensity in bifrontal subcortical white matter compatible with minimal chronic microvascular ischemic changes. 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 Skull and upper cervical spine: Normal marrow signal. Sinuses/Orbits: Negative. Other: None. MRA HEAD FINDINGS Internal carotid arteries:  Patent. Anterior cerebral arteries: Patent. Large right A1 and anterior communicating artery, small left A1, normal variant. Middle  cerebral arteries: Patent. Anterior communicating artery: Patent. 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 Posterior communicating arteries:  Patent. Posterior cerebral arteries:  Patent.  Fetal left PCA.  Basilar artery:  Patent. Vertebral arteries:  Patent. No evidence of high-grade stenosis or large vessel occlusion. IMPRESSION: MRI head: 1. No acute intracranial abnormality identified. 2. Minimal chronic microvascular ischemic changes of white matter. MRA head: 1. 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 2. No large vessel occlusion or high-grade stenosis. Electronically Signed   By: Kristine Garbe M.D.   On: 05/17/2018 23:41   Mr Lumbar Spine Wo Contrast  Result Date: 05/18/2018 CLINICAL DATA:  57 y/o M; right leg weakness and transient blurred vision. EXAM: MRI LUMBAR SPINE WITHOUT CONTRAST TECHNIQUE: Multiplanar, multisequence MR imaging of the lumbar spine was performed. No intravenous contrast was administered. COMPARISON:  None. FINDINGS: Segmentation:  Standard. Alignment:  Physiologic. Vertebrae:  No fracture, evidence of discitis, or bone lesion. Conus medullaris and cauda equina: Conus extends to the L1 level. Conus and cauda equina appear normal. Paraspinal and other soft tissues: Negative. Disc levels: L1-2:  Mild disc bulge.  No significant foraminal or canal stenosis. L2-3: Mild disc bulge and facet hypertrophy. No significant foraminal or canal stenosis. L3-4: Mild disc bulge and facet hypertrophy. Mild loss of intervertebral disc space height. No significant foraminal or canal stenosis. L4-5: Mild disc bulge and facet hypertrophy. Mild loss of intervertebral disc space height. No significant foraminal or canal stenosis. L5-S1: Mild disc bulge and facet hypertrophy. No significant foraminal or canal stenosis. IMPRESSION: 1. No acute osseous abnormality or malalignment. 2. Mild disc and facet degenerative changes of the lumbar spine. No significant foraminal or canal stenosis. Electronically Signed   By: Kristine Garbe M.D.   On: 05/18/2018 00:04   Mr Jodene Nam Head Wo Contrast  Result Date: 05/17/2018 CLINICAL DATA:  57 y/o M;  right leg weakness and transient blurred vision. EXAM: MRI HEAD WITHOUT CONTRAST MRA HEAD WITHOUT CONTRAST TECHNIQUE: Multiplanar, multiecho pulse sequences of the brain and surrounding structures were obtained without intravenous contrast. Angiographic images of the head were obtained using MRA technique without contrast. COMPARISON:  05/17/2018 CT head FINDINGS: MRI HEAD FINDINGS Brain: No acute infarction, hemorrhage, hydrocephalus, extra-axial collection or mass lesion. Few foci of T2 FLAIR hyperintensity in bifrontal subcortical white matter compatible with minimal chronic microvascular ischemic changes. 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 Skull and upper cervical spine: Normal marrow signal. Sinuses/Orbits: Negative. Other: None. MRA HEAD FINDINGS Internal carotid arteries:  Patent. Anterior cerebral arteries: Patent. Large right A1 and anterior communicating artery, small left A1, normal variant. Middle cerebral arteries: Patent. Anterior communicating artery: Patent. 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 Posterior communicating arteries:  Patent. Posterior cerebral arteries:  Patent.  Fetal left PCA. Basilar artery:  Patent. Vertebral arteries:  Patent. No evidence of high-grade stenosis or large vessel occlusion. IMPRESSION: MRI head: 1. No acute intracranial abnormality identified. 2. Minimal chronic microvascular ischemic changes of white matter. MRA head: 1. 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 2. No large vessel occlusion or high-grade stenosis. Electronically Signed   By: Kristine Garbe M.D.   On: 05/17/2018 23:41   Ct Head Code Stroke Wo Contrast  Result Date: 05/17/2018 CLINICAL DATA:  Code stroke. RIGHT-sided facial droop with weakness. Last known normal 8 hours  ago. EXAM: CT HEAD WITHOUT CONTRAST TECHNIQUE: Contiguous axial  images were obtained from the base of the skull through the vertex without intravenous contrast. COMPARISON:  None. FINDINGS: Brain: No evidence of acute infarction, hemorrhage, hydrocephalus, extra-axial collection or mass lesion/mass effect. Vascular: No hyperdense vessel or unexpected calcification. Skull: Normal. Negative for fracture or focal lesion. Sinuses/Orbits: No acute finding. Other: None. ASPECTS [facility] Stroke Program Early CT Score) - Ganglionic level infarction (caudate, lentiform nuclei, internal capsule, insula, M1-M3 cortex): 7 - Supraganglionic infarction (M4-M6 cortex): 3 Total score (0-10 with 10 being normal): 10 IMPRESSION: 1. Normal CT head. 2. ASPECTS is 10. These results were communicated to Dr. Cheral Marker at [place] 7/15/2019by text page via the [facility] messaging system. Electronically Signed   By: Staci Righter M.D.   On: 05/17/2018 20:13    DISCHARGE EXAMINATION: Vitals:   05/18/18 0418 05/18/18 0654 05/18/18 0735 05/18/18 1115  BP: 112/66 129/84 134/90 116/85  Pulse: (!) 59 66 67 67  Resp: 18 18 18 18   Temp: 97.8 F (36.6 C) (!) 97.5 F (36.4 C) 98.4 F (36.9 C) 98.2 F (36.8 C)  TempSrc: Oral Oral    SpO2: 100% 99% 100% 100%  Weight:      Height:       General appearance: alert, cooperative, appears stated age and no distress Resp: clear to auscultation bilaterally Cardio: regular rate and rhythm, S1, S2 normal, no murmur, click, rub or gallop GI: soft, non-tender; bowel sounds normal; no masses,  no organomegaly Extremities: extremities normal, atraumatic, no cyanosis or edema Neurologic: Cranial nerves II to XII intact.  Motor strength equal bilateral upper extremities.  Ankle flexors 5 out of 5.  Ankle extensors 4 out of 5.  DISPOSITION: Home  Discharge Instructions    Ambulatory referral to Neurology   Complete by:  As directed    An appointment is requested in approximately: 1 week with any neurologist who manages foot drops and does MEG/NCV   Call  MD for:  extreme fatigue   Complete by:  As directed    Call MD for:  persistant dizziness or light-headedness   Complete by:  As directed    Call MD for:  persistant nausea and vomiting   Complete by:  As directed    Call MD for:  severe uncontrolled pain   Complete by:  As directed    Call MD for:  temperature >100.4   Complete by:  As directed    Discharge instructions   Complete by:  As directed    Please keep your outpatient appointments.  Avoid crossing your legs.  You were cared for by a hospitalist during your hospital stay. If you have any questions about your discharge medications or the care you received while you were in the hospital after you are discharged, you can call the unit and asked to speak with the hospitalist on call if the hospitalist that took care of you is not available. Once you are discharged, your primary care physician will handle any further medical issues. Please note that NO REFILLS for any discharge medications will be authorized once you are discharged, as it is imperative that you return to your primary care physician (or establish a relationship with a primary care physician if you do not have one) for your aftercare needs so that they can reassess your need for medications and monitor your lab values. If you do not have a primary care physician, you can call [phone] for a physician referral.  Increase activity slowly   Complete by:  As directed         Allergies as of 05/18/2018   No Known Allergies     Medication List    TAKE these medications   amLODipine 5 MG tablet Commonly known as:  NORVASC Take 1 tablet (5 mg total) by mouth daily.        Follow-up Information    Luanne Bras, MD Follow up.   Specialties:  Interventional Radiology, Radiology Why:  Appointment on 05/27/18  @ 1pm  Contact information: [place] [address] [phone]        Tonia Ghent, MD. Schedule an appointment as soon as  possible for a visit in 1 week(s).   Specialty:  Family Medicine Contact information: [place] [address] [phone]        Narda Amber K, DO Follow up.   Specialty:  Neurology Why:  on 06/01/18 at 1:30pm Contact information: [place] [address] [place] [address] [phone]           TOTAL DISCHARGE TIME: 35 minutes  Bonnielee Haff  [facility] Pager [phone]  05/18/2018, 11:54 AM

## 2018-05-18 NOTE — Consult Note (Addendum)
NEURO HOSPITALIST CONSULT NOTE   Requestig physician: Dr. Hazle Nordmann  Reason for Consult: Right foot drop  History obtained from:   Patient and Chart     HPI:                                                                                                                                          Douglas Mathews is an 57 y.o. male who presented to the ED with right lower extremity weakness and questionable right facial droop beginning at noon today. LKN is the same as TOSO. First symptom was trouble keeping his right flip flop on his foot. He would also have to lift his right foot higher off the ground than his left as the toe was dragging and he felt as though he might trip. The right foot would "slap" back down on the ground with each footfall, which patient noted was abnormal. After the foot symptom began, he started to experience blurry vision while working at this computer. ED Triage note documents that he had "numbness" on his right side, but he denies such during interview with Neurologist. Wife felt that the right side of his face was drooping, but after additional family members arrived, they stated his face looked normal. Triage note documents some slight right hand weakness, but patient also denies this symptom. No headache, neck pain, chest pain, abdominal pain, trouble talking, chewing or swallowing. No leg or arm pain. He does endorse some back pain recently, exacerbated with twisting motions. No radiating pain down either leg.   Past Medical History:  Diagnosis Date  . GI bleed    due to bleeding polyp  . History of MRI 03/2004   head, nml [name])  . Hypertension   . Rash    on buttock, respods to clobetasol    Past Surgical History:  Procedure Laterality Date  . [facility])  06/07-06/07/2009   Anemia due to blood loss hypokalemia Erythr prophyria GI bleed from polyp  . VASECTOMY  04/03/00   [facility])    Family History  Problem Relation Age of Onset   . Heart disease Father        Afib  . Prostate cancer Neg Hx   . Colon cancer Neg Hx               Social History:  reports that he has never smoked. He has never used smokeless tobacco. He reports that he drinks alcohol. He reports that he does not use drugs.  No Known Allergies  HOME MEDICATIONS:  Norvasc   ROS:                                                                                                                                       No fevers or chills. Other ROS as per HPI.    Blood pressure 116/79, pulse 78, temperature 98 F (36.7 C), temperature source Oral, resp. rate 18, SpO2 99 %.   General Examination:                                                                                                       Physical Exam  HEENT-  [place]/AT    Lungs- Respirations unlabored  Extremities- No edema Musculoskeletal-no gross joint swelling   Neurological Examination Mental Status: Alert, oriented, thought content appropriate.  Speech fluent without evidence of aphasia.  Able to follow all commands without difficulty. Cranial Nerves: II: Visual fields intact. PERRL III,IV, VI: EOMI without nystagmus. No ptosis. V,VII: Subtle asymmetry of right lower quadrant of face relative to left, however facial contraction is briskly equal bilaterally. Temp sensation equal bilaterally.  VIII: hearing intact to voice IX,X: no hypophonia XI: Symmetric XII: Borderline deviation of tongue to right Motor: RLE with 4-/5 weakness of foot dorsiflexion, with 4+/5 strength of ankle inversion and eversion. Otherwise, RLE 5/5.  RUE: 5/5 LUE and LLE 5/5 Sensory: Temp and FT intact x 4, including right foot.  Deep Tendon Reflexes: 2+ and symmetric throughout, including achilles bilaterally. Toes downgoing bilaterally.  Cerebellar: No ataxia with FNF or H-S bilaterally. Gait:  Deferred   Lab Results: Basic Metabolic Panel: Recent Labs  Lab 05/17/18 1957 05/17/18 2017  NA 139 139  K 3.6 3.7  CL 104 102  CO2 26  --   GLUCOSE 110* 108*  BUN 15 17  CREATININE 1.03 1.00  CALCIUM 9.3  --     CBC: Recent Labs  Lab 05/17/18 1957 05/17/18 2017  WBC 8.0  --   NEUTROABS 5.3  --   HGB 13.0 13.3  HCT 39.1 39.0  MCV 85.0  --   PLT 266  --     Cardiac Enzymes: No results for input(s): CKTOTAL, CKMB, CKMBINDEX, TROPONINI in the last 168 hours.  Lipid Panel: No results for input(s): CHOL, TRIG, HDL, CHOLHDL, VLDL, LDLCALC in the last 168 hours.  Imaging: Mr Brain Wo Contrast  Result Date: 05/17/2018 CLINICAL DATA:  57 y/o M; right leg weakness and transient blurred vision. EXAM: MRI HEAD WITHOUT CONTRAST MRA HEAD WITHOUT  CONTRAST TECHNIQUE: Multiplanar, multiecho pulse sequences of the brain and surrounding structures were obtained without intravenous contrast. Angiographic images of the head were obtained using MRA technique without contrast. COMPARISON:  05/17/2018 CT head FINDINGS: MRI HEAD FINDINGS Brain: No acute infarction, hemorrhage, hydrocephalus, extra-axial collection or mass lesion. Few foci of T2 FLAIR hyperintensity in bifrontal subcortical white matter compatible with minimal chronic microvascular ischemic changes. 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 Skull and upper cervical spine: Normal marrow signal. Sinuses/Orbits: Negative. Other: None. MRA HEAD FINDINGS Internal carotid arteries:  Patent. Anterior cerebral arteries: Patent. Large right A1 and anterior communicating artery, small left A1, normal variant. Middle cerebral arteries: Patent. Anterior communicating artery: Patent. 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 Posterior communicating arteries:  Patent. Posterior cerebral arteries:  Patent.  Fetal left PCA. Basilar artery:  Patent.  Vertebral arteries:  Patent. No evidence of high-grade stenosis or large vessel occlusion. IMPRESSION: MRI head: 1. No acute intracranial abnormality identified. 2. Minimal chronic microvascular ischemic changes of white matter. MRA head: 1. 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 2. No large vessel occlusion or high-grade stenosis. Electronically Signed   By: Kristine Garbe M.D.   On: 05/17/2018 23:41   Mr Lumbar Spine Wo Contrast  Result Date: 05/18/2018 CLINICAL DATA:  57 y/o M; right leg weakness and transient blurred vision. EXAM: MRI LUMBAR SPINE WITHOUT CONTRAST TECHNIQUE: Multiplanar, multisequence MR imaging of the lumbar spine was performed. No intravenous contrast was administered. COMPARISON:  None. FINDINGS: Segmentation:  Standard. Alignment:  Physiologic. Vertebrae:  No fracture, evidence of discitis, or bone lesion. Conus medullaris and cauda equina: Conus extends to the L1 level. Conus and cauda equina appear normal. Paraspinal and other soft tissues: Negative. Disc levels: L1-2:  Mild disc bulge.  No significant foraminal or canal stenosis. L2-3: Mild disc bulge and facet hypertrophy. No significant foraminal or canal stenosis. L3-4: Mild disc bulge and facet hypertrophy. Mild loss of intervertebral disc space height. No significant foraminal or canal stenosis. L4-5: Mild disc bulge and facet hypertrophy. Mild loss of intervertebral disc space height. No significant foraminal or canal stenosis. L5-S1: Mild disc bulge and facet hypertrophy. No significant foraminal or canal stenosis. IMPRESSION: 1. No acute osseous abnormality or malalignment. 2. Mild disc and facet degenerative changes of the lumbar spine. No significant foraminal or canal stenosis. Electronically Signed   By: Kristine Garbe M.D.   On: 05/18/2018 00:04   Mr Jodene Nam Head Wo Contrast  Result Date: 05/17/2018 CLINICAL DATA:  57 y/o M; right leg weakness and  transient blurred vision. EXAM: MRI HEAD WITHOUT CONTRAST MRA HEAD WITHOUT CONTRAST TECHNIQUE: Multiplanar, multiecho pulse sequences of the brain and surrounding structures were obtained without intravenous contrast. Angiographic images of the head were obtained using MRA technique without contrast. COMPARISON:  05/17/2018 CT head FINDINGS: MRI HEAD FINDINGS Brain: No acute infarction, hemorrhage, hydrocephalus, extra-axial collection or mass lesion. Few foci of T2 FLAIR hyperintensity in bifrontal subcortical white matter compatible with minimal chronic microvascular ischemic changes. 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 Skull and upper cervical spine: Normal marrow signal. Sinuses/Orbits: Negative. Other: None. MRA HEAD FINDINGS Internal carotid arteries:  Patent. Anterior cerebral arteries: Patent. Large right A1 and anterior communicating artery, small left A1, normal variant. Middle cerebral arteries: Patent. Anterior communicating artery: Patent. 6 mm anterior communicating artery aneurysm incorporating the right ACA and anterior communicating  artery. Posterior communicating arteries:  Patent. Posterior cerebral arteries:  Patent.  Fetal left PCA. Basilar artery:  Patent. Vertebral arteries:  Patent. No evidence of high-grade stenosis or large vessel occlusion. IMPRESSION: MRI head: 1. No acute intracranial abnormality identified. 2. Minimal chronic microvascular ischemic changes of white matter. MRA head: 1. 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 2. No large vessel occlusion or high-grade stenosis. Electronically Signed   By: Kristine Garbe M.D.   On: 05/17/2018 23:41   Ct Head Code Stroke Wo Contrast  Result Date: 05/17/2018 CLINICAL DATA:  Code stroke. RIGHT-sided facial droop with weakness. Last known normal 8 hours ago. EXAM: CT HEAD WITHOUT CONTRAST TECHNIQUE: Contiguous axial images were obtained  from the base of the skull through the vertex without intravenous contrast. COMPARISON:  None. FINDINGS: Brain: No evidence of acute infarction, hemorrhage, hydrocephalus, extra-axial collection or mass lesion/mass effect. Vascular: No hyperdense vessel or unexpected calcification. Skull: Normal. Negative for fracture or focal lesion. Sinuses/Orbits: No acute finding. Other: None. ASPECTS [facility] Stroke Program Early CT Score) - Ganglionic level infarction (caudate, lentiform nuclei, internal capsule, insula, M1-M3 cortex): 7 - Supraganglionic infarction (M4-M6 cortex): 3 Total score (0-10 with 10 being normal): 10 IMPRESSION: 1. Normal CT head. 2. ASPECTS is 10. These results were communicated to Dr. Cheral Marker at [place] 7/15/2019by text page via the [facility] messaging system. Electronically Signed   By: Staci Righter M.D.   On: 05/17/2018 20:13    Assessment: 57 year old male with new onset of right foot drop. 1. MRI head reveals no stroke or other structural etiology for the new right lower extremity deficit. Minimal chronic microvascular ischemic changes of white matter are noted.  2. 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 No large vessel occlusion or high-grade stenosis is seen.   3. MRI lumbar spine reveals no nerve root impingement.  4. Given negative tests above, the etiology for the right foot drop is most likely due to a peripheral motor nerve lesion, distal to the L5 nerve root. Possible localizations include a focal lesion within the right lumbar plexus, the right sciatic nerve, right common peroneal nerve or right deep peroneal nerve.    Recommendations: 1. Expedited outpatient neurology evaluation at [facility] or [facility] for EMG/NCS or RLE.  2. PT 3. Avoid crossing legs or prolonged kneeling.  4. Outpatient follow up should be scheduled with Dr. Juliet Rude for possible management versus  surveillance of 6 mm anterior circulation aneurysm.   Electronically signed: Dr. Kerney Elbe 05/18/2018, 12:16 AM

## 2018-05-18 NOTE — Evaluation (Signed)
Physical Therapy Evaluation Patient Details Name: Douglas Mathews MRN: [id number] DOB: December 02, 1960 Today's Date: 05/18/2018   History of Present Illness  Patient is a 57 y.o male presenting with R LE weakness and questionable facial droop. MRI head reveals no stroke or other structural etiology for the new right lower extremity deficit. 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 MRI lumbar spine reveals no nerve root impingement. Per MD note, considering peripheral motor nerve lesion. Patient with a PMH significant for HTN.  Clinical Impression  Douglas Mathews is a very pleasant 57 y.o male admitted with the above listed diagnosis. Patient reporting that prior to admission, he was very active and independent with all aspects of mobility. Patient today performing all transfers and mobility at Sup to Mod I level for general safety. Patient does present with some R LE weakness as compared to L LE as well as mild gait deficits. Patient feels as if he is functioning at baseline. No further acute PT needs identified. PT to recommend Outpatient PT to ensure full strength recovery of R LE. PT to sign off. Thanks for the referral.     Follow Up Recommendations Outpatient PT    Equipment Recommendations  None recommended by PT    Recommendations for Other Services       Precautions / Restrictions Precautions Precautions: Fall Restrictions Weight Bearing Restrictions: No      Mobility  Bed Mobility Overal bed mobility: Modified Independent             General bed mobility comments: increased time due to line management  Transfers Overall transfer level: Modified independent Equipment used: None                Ambulation/Gait Ambulation/Gait assistance: Supervision;Modified independent (Device/Increase time) Gait Distance (Feet): 250 Feet Assistive device: None Gait Pattern/deviations: Step-through  pattern;Decreased dorsiflexion - right;Narrow base of support Gait velocity: WNL   General Gait Details: patient reporting increased awareness of R LE with gait; feels like he needs to pick it up more compared to L LE  Stairs Stairs: Yes Stairs assistance: Supervision;Modified independent (Device/Increase time) Stair Management: No rails;Alternating pattern;Forwards Number of Stairs: 3(x 2) General stair comments: patient reporting stair navigation at baseline; does not feel as if he would trip   Wheelchair Mobility    Modified Rankin (Stroke Patients Only)       Balance Overall balance assessment: Modified Independent                                           Pertinent Vitals/Pain Pain Assessment: No/denies pain    Home Living Family/patient expects to be discharged to:: Private residence Living Arrangements: Spouse/significant other   Type of Home: House Home Access: Stairs to enter   Technical brewer of Steps: 3 Home Layout: Two level Home Equipment: None      Prior Function Level of Independence: Independent         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, newspaper        Extremity/Trunk Assessment   Upper Extremity Assessment Upper Extremity Assessment: Overall WFL for tasks assessed    Lower Extremity Assessment Lower Extremity Assessment: RLE deficits/detail RLE Deficits / Details: grossly 4/5 with knee extension and ankle DF RLE Sensation: WNL RLE Coordination: WNL    Cervical /  Trunk Assessment Cervical / Trunk Assessment: Normal  Communication   Communication: No difficulties  Cognition Arousal/Alertness: Awake/alert Behavior During Therapy: WFL for tasks assessed/performed Overall Cognitive Status: Within Functional Limits for tasks assessed                                        General Comments      Exercises     Assessment/Plan    PT Assessment Patent does not need any further  PT services  PT Problem List         PT Treatment Interventions      PT Goals (Current goals can be found in the Care Plan section)  Acute Rehab PT Goals Patient Stated Goal: return home, improve R LE strength PT Goal Formulation: With patient Time For Goal Achievement: 05/25/18 Potential to Achieve Goals: Good    Frequency     Barriers to discharge        Co-evaluation               AM-PAC PT "6 Clicks" Daily Activity  Outcome Measure Difficulty turning over in bed (including adjusting bedclothes, sheets and blankets)?: A Little Difficulty moving from lying on back to sitting on the side of the bed? : A Little Difficulty sitting down on and standing up from a chair with arms (e.g., wheelchair, bedside commode, etc,.)?: A Little Help needed moving to and from a bed to chair (including a wheelchair)?: A Little Help needed walking in hospital room?: A Little Help needed climbing 3-5 steps with a railing? : A Little 6 Click Score: 18    End of Session Equipment Utilized During Treatment: Gait belt Activity Tolerance: Patient tolerated treatment well Patient left: in bed;with call bell/phone within reach;with family/visitor present Nurse Communication: Mobility status PT Visit Diagnosis: Unsteadiness on feet (R26.81);Other abnormalities of gait and mobility (R26.89)    Time: 3291-9166 PT Time Calculation (min) (ACUTE ONLY): 18 min   Charges:   PT Evaluation $PT Eval Low Complexity: 1 Low     PT G Codes:        Lanney Gins, PT, DPT 05/18/18 8:57 AM Pager: [phone]

## 2018-05-18 NOTE — Progress Notes (Signed)
Spoke to [place] at [facility]. Confirmed that NCV with EMG scheduled on discharge paperwork for July 23 is same as test scheduled for July 30th with Dr Posey Pronto. Notified pt of same.

## 2018-05-18 NOTE — Evaluation (Signed)
Occupational Therapy Evaluation and Discharge Patient Details Name: Douglas Mathews MRN: [id number] DOB: 01/03/1961 Today's Date: 05/18/2018    History of Present Illness Patient is a 57 y.o male presenting with R LE weakness and questionable facial droop. MRI head reveals no stroke or other structural etiology for the new right lower extremity deficit. 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 MRI lumbar spine reveals no nerve root impingement. Per MD note, considering peripheral motor nerve lesion. Patient with a PMH significant for HTN.   Clinical Impression   Pt reports he was independent with ADL PTA. Currently pt mod I with ADL and functional mobility. Educated pt on home safety strategies. Pt planning to d/c home with family support. No further acute OT needs identified; signing off at this time. Please re-consult if needs change. Thank you for this referral.    Follow Up Recommendations  No OT follow up    Equipment Recommendations  None recommended by OT    Recommendations for Other Services       Precautions / Restrictions Precautions Precautions: Fall Restrictions Weight Bearing Restrictions: No      Mobility Bed Mobility Overal bed mobility: Modified Independent             General bed mobility comments: HOB elevated  Transfers Overall transfer level: Modified independent Equipment used: None                  Balance Overall balance assessment: Modified Independent;Mild deficits observed, not formally tested                                         ADL either performed or assessed with clinical judgement   ADL Overall ADL's : Modified independent                                       General ADL Comments: Slight increase in time to perform. Pt steady and safe with all tasks     Vision Patient Visual Report: No change from baseline Vision  Assessment?: No apparent visual deficits     Perception     Praxis      Pertinent Vitals/Pain Pain Assessment: No/denies pain     Hand Dominance Right   Extremity/Trunk Assessment Upper Extremity Assessment Upper Extremity Assessment: Overall WFL for tasks assessed   Lower Extremity Assessment Lower Extremity Assessment: Defer to PT evaluation   Cervical / Trunk Assessment Cervical / Trunk Assessment: Normal   Communication Communication Communication: No difficulties   Cognition Arousal/Alertness: Awake/alert Behavior During Therapy: WFL for tasks assessed/performed Overall Cognitive Status: Within Functional Limits for tasks assessed                                     General Comments       Exercises     Shoulder Instructions      Home Living Family/patient expects to be discharged to:: Private residence Living Arrangements: Spouse/significant other Available Help at Discharge: Family Type of Home: House Home Access: Stairs to enter Technical brewer of Steps: 3   Home Layout: Two level;Bed/bath upstairs Alternate Level Stairs-Number of Steps: flight   Bathroom Shower/Tub: Tub/shower unit  Bathroom Toilet: Standard     Home Equipment: None      Lives With: Spouse    Prior Functioning/Environment Level of Independence: Independent        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"        OT Problem List: Decreased strength      OT Treatment/Interventions:      OT Goals(Current goals can be found in the care plan section) Acute Rehab OT Goals Patient Stated Goal: return home, improve R LE strength OT Goal Formulation: All assessment and education complete, DC therapy  OT Frequency:     Barriers to D/C:            Co-evaluation              AM-PAC PT "6 Clicks" Daily Activity     Outcome Measure Help from another person eating meals?: None Help from another person taking care of personal grooming?: None Help  from another person toileting, which includes using toliet, bedpan, or urinal?: None Help from another person bathing (including washing, rinsing, drying)?: None Help from another person to put on and taking off regular upper body clothing?: None Help from another person to put on and taking off regular lower body clothing?: None 6 Click Score: 24   End of Session    Activity Tolerance: Patient tolerated treatment well Patient left: in bed;with call bell/phone within reach;with family/visitor present  OT Visit Diagnosis: Unsteadiness on feet (R26.81)                Time: 0938-1829 OT Time Calculation (min): 10 min Charges:  OT General Charges $OT Visit: 1 Visit OT Evaluation $OT Eval Low Complexity: 1 Low G-Codes:     Hadi Dubin A. Ulice Brilliant, M.S., OTR/L Acute Rehab Department: [phone]  Binnie Kand 05/18/2018, 12:01 PM

## 2018-05-18 NOTE — Evaluation (Signed)
Speech Language Pathology Evaluation Patient Details Name: Douglas Mathews MRN: [id number] DOB: 1961-03-09 Today's Date: 05/18/2018 Time: 7169-6789 SLP Time Calculation (min) (ACUTE ONLY): 15 min  Problem List:  Patient Active Problem List   Diagnosis Date Noted  . Acute right-sided weakness 05/17/2018  . Lower urinary tract symptoms (LUTS) 11/21/2015  . Advance care planning 11/17/2014  . 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 Routine general medical examination at a health care facility 09/06/2013  . Rash 11/09/2011  . Vertigo 11/09/2011  . ALLERGIC RHINITIS, SEASONAL 08/09/2009  . COLONIC POLYPS 04/17/2009  . Essential hypertension 11/16/2002  . LEUKOPENIA, CHRONIC 01/01/2001   Past Medical History:  Past Medical History:  Diagnosis Date  . GI bleed    due to bleeding polyp  . History of MRI 03/2004   head, nml [name])  . Hypertension   . Rash    on buttock, respods to clobetasol   Past Surgical History:  Past Surgical History:  Procedure Laterality Date  . [facility])  06/07-06/07/2009   Anemia due to blood loss hypokalemia Erythr prophyria GI bleed from polyp  . VASECTOMY  04/03/00   [facility])   HPI:  Douglas Mathews is a 57 y.o. male with medical history significant for hypertension, now presenting to the emergency department for evaluation of right leg weakness and transient blurred vision. Patient reports that he has been in his usual state of health and was having an uneventful day until around noon when he noted some subtle problems with his right leg, described as a "heaviness." He reports that his sandal on his right foot kept falling off. He had an episode of blurred vision and that seemed to resolve, but the right distal leg weakness persisted. He denies any chest pain or palpitations, denies headache, and denies ever experiencing this previously. No recent fevers, chills, or shortness of breath. Denies back pain.  MRI revealed no acute  intracranial abnormality identified with minimal chronic microvascular ischemic changes of white matter. 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   Assessment / Plan / Recommendation Clinical Impression  Pt presents with functional cognitive abilities. PT able to complete complex problem slving task, demonstrate appropriate recall of information and demonstrate safety awareness. No further skilled ST is required, ST to sign off.     SLP Assessment  SLP Recommendation/Assessment: Patient does not need any further Speech Lanaguage Pathology Services SLP Visit Diagnosis: Cognitive communication deficit (R41.841)    Follow Up Recommendations  None    Frequency and Duration           SLP Evaluation Cognition  Overall Cognitive Status: Within Functional Limits for tasks assessed Arousal/Alertness: Awake/alert Orientation Level: Oriented X4       Comprehension  Auditory Comprehension Overall Auditory Comprehension: Appears within functional limits for tasks assessed Visual Recognition/Discrimination Discrimination: Within Function Limits Reading Comprehension Reading Status: Within funtional limits    Expression Expression Primary Mode of Expression: Verbal Verbal Expression Overall Verbal Expression: Appears within functional limits for tasks assessed Pragmatics: No impairment Non-Verbal Means of Communication: Not applicable Written Expression Dominant Hand: Right Written Expression: Within Functional Limits   Oral / Motor  Oral Motor/Sensory Function Overall Oral Motor/Sensory Function: Within functional limits Motor Speech Overall Motor Speech: Appears within functional limits for tasks assessed Respiration: Within functional limits Phonation: Normal Resonance: Within functional limits Articulation: Within functional limitis Intelligibility:  Intelligible Motor Planning:  [place] functional limits Motor Speech Errors: Not applicable   GO                    Vyctoria Dickman 05/18/2018, 9:44 AM

## 2018-05-19 ENCOUNTER — Telehealth: Payer: Self-pay | Admitting: *Deleted

## 2018-05-19 LAB — HEMOGLOBIN A1C
HEMOGLOBIN A1C: 5.2 % (ref 4.8–5.6)
Mean Plasma Glucose: 103 mg/dL

## 2018-05-19 NOTE — Telephone Encounter (Signed)
Lm requesting return call to complete TCM and confirm hosp f/u appt  

## 2018-05-20 ENCOUNTER — Other Ambulatory Visit (HOSPITAL_COMMUNITY): Payer: Self-pay | Admitting: Interventional Radiology

## 2018-05-20 DIAGNOSIS — I729 Aneurysm of unspecified site: Secondary | ICD-10-CM

## 2018-05-20 NOTE — Telephone Encounter (Signed)
Transition Care Management Follow-up Telephone Call   Date discharged? 05/18/2018   How have you been since you were released from the hospital? "fine. I feel like its improved since yesterday   Do you understand why you were in the hospital? yes   Do you understand the discharge instructions? yes   Where were you discharged to? home2   Items Reviewed:  Medications reviewed: yes  Allergies reviewed: yes  Dietary changes reviewed: yes  Referrals reviewed: yes   Functional Questionnaire:   Activities of Daily Living (ADLs):   He states they are independent in the following: independent in all areas    Any transportation issues/concerns?: no   Any patient concerns? no   Confirmed importance and date/time of follow-up visits scheduled no, message has been sent to [name] regarding f/u. States he is seeing to specialist already and is wanting to know if a f/u is required    Confirmed with patient if condition begins to worsen call PCP or go to the ER.  Patient was given the office number and encouraged to call back with question or concerns.  : yes

## 2018-05-21 ENCOUNTER — Ambulatory Visit: Payer: 59 | Admitting: Family Medicine

## 2018-05-27 ENCOUNTER — Ambulatory Visit (HOSPITAL_COMMUNITY): Payer: 59

## 2018-05-27 ENCOUNTER — Ambulatory Visit (HOSPITAL_COMMUNITY)
Admission: RE | Admit: 2018-05-27 | Discharge: 2018-05-27 | Disposition: A | Discharge status: Home / Self Care | Payer: 59 | Source: Ambulatory Visit | Attending: Interventional Radiology | Admitting: Interventional Radiology

## 2018-05-27 DIAGNOSIS — I729 Aneurysm of unspecified site: Secondary | ICD-10-CM

## 2018-05-27 NOTE — Consult Note (Signed)
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 at the request of Dr Bruce Donath  Referring Physician(s): Dr Kerney Elbe  Supervising Physician: Luanne Bras  Patient Status: Douglas Mathews - Out-pt  History of Present Illness: Douglas Mathews is a 57 y.o. male   Was evaluated by Neurology Dr Bruce Donath 05/18/18. Rt lower extremity weakness; right facial droop Right foot drop Soon after onset of these symptoms-- noted blurred vision and right side numbness Denied headaches; neck or chest pain No visual or speech changes No N/V  MR: 7/15:  IMPRESSION: MRI head: 1. No acute intracranial abnormality identified. 2. Minimal chronic microvascular ischemic changes of white matter.  MRA head: 1. 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 2. No large vessel occlusion or high-grade stenosis.  Recommendations: 1. Expedited outpatient neurology evaluation at [facility] or [facility] for EMG/NCS or RLE.  2. PT 3. Avoid crossing legs or prolonged kneeling.  4. Outpatient follow up should be scheduled with Dr. Juliet Rude for possible management versus surveillance of 6 mm anterior circulation aneurysm.   Here today for evaluation with Dr Estanislado Pandy. States he is asymptomatic at this point No neuro symptoms Denies HA; dizziness Nonsmoker; caffeine 3 cups per day Drinks 12 pack beer weekly No recreational drugs  No allergies    Past Medical History:  Diagnosis Date  . GI bleed    due to bleeding polyp  . History of MRI 03/2004   head, nml Douglas Mathews)  . Hypertension   . Rash    on buttock, respods to clobetasol    Past Surgical History:  Procedure Laterality Date  . [facility])  06/07-06/07/2009   Anemia due to blood loss hypokalemia Erythr prophyria GI bleed from  polyp  . VASECTOMY  04/03/00   [facility])    Allergies: Patient has no known allergies.  Medications: Prior to Admission medications   Medication Sig Start Date End Date Taking? Authorizing Provider  amLODipine (NORVASC) 5 MG tablet Take 1 tablet (5 mg total) by mouth daily. 04/10/17   Tonia Ghent, MD     Family History  Problem Relation Age of Onset  . Heart disease Father        Afib  . Prostate cancer Neg Hx   . Colon cancer Neg Hx     Social History   Socioeconomic History  . Marital status: Married    Spouse name: Not on file  . Number of children: 2  . Years of education: Not on file  . Highest education level: Not on file  Occupational History  . Occupation: Hydrologist: [employer]  . Financial resource strain: Not on file  . Food insecurity:    Worry: Not on file    Inability: Not on file  . Transportation needs:    Medical: Not on file    Non-medical: Not on file  Tobacco Use  . Smoking status: Never Smoker  . Smokeless tobacco: Never Used  Substance and Sexual Activity  . Alcohol use: Yes    Comment: 6 pack per week  . Drug use: No  . Sexual activity: Not on file  Lifestyle  . Physical activity:    Days per week: Not on file    Minutes per session: Not on file  . Stress: Not on file  Relationships  . Social connections:    Talks on phone: Not on file    Gets together: Not on file    Attends religious service: Not on file    Active member of club or organization: Not on file    Attends meetings of clubs or organizations: Not on file    Relationship status: Not on file  Other Topics Concern  . Not on file  Social History Narrative   Divorced 2010   Remarried 2013   2 biological daughters, 2 step daughters    Event organiser   Review of Systems: A 12 point ROS discussed and pertinent positives are indicated in the HPI above.  All other systems are negative.  Review of Systems  Constitutional: Negative  for activity change, fatigue and fever.  Respiratory: Negative for cough and shortness of breath.   Neurological: Negative for dizziness, speech difficulty, weakness and headaches.  Psychiatric/Behavioral: Negative for confusion and decreased concentration.    Vital Signs: There were no vitals taken for this visit.  Physical Exam  Constitutional: He is oriented to person, place, and time.  Musculoskeletal: Normal range of motion.  Neurological: He is alert and oriented to person, place, and time.  Skin: Skin is warm and dry.  Psychiatric: He has a normal mood and affect. His behavior is normal.  Nursing note and vitals reviewed.   Imaging: Mr Brain Wo Contrast  Result Date: 05/17/2018 CLINICAL DATA:  57 y/o M; right leg weakness and transient blurred vision. EXAM: MRI HEAD WITHOUT CONTRAST MRA HEAD WITHOUT CONTRAST TECHNIQUE: Multiplanar, multiecho pulse sequences of the brain and surrounding structures were obtained without intravenous contrast. Angiographic images of the head were obtained using MRA technique without contrast. COMPARISON:  05/17/2018 CT head FINDINGS: MRI HEAD FINDINGS Brain: No acute infarction, hemorrhage, hydrocephalus, extra-axial collection or mass lesion. Few foci of T2 FLAIR hyperintensity in bifrontal subcortical white matter compatible with minimal chronic microvascular ischemic changes. 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 Skull and upper cervical spine: Normal marrow signal. Sinuses/Orbits: Negative. Other: None. MRA HEAD FINDINGS Internal carotid arteries:  Patent. Anterior cerebral arteries: Patent. Large right A1 and anterior communicating artery, small left A1, normal variant. Middle cerebral arteries: Patent. Anterior communicating artery: Patent. 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 Posterior communicating arteries:  Patent. Posterior cerebral  arteries:  Patent.  Fetal left PCA. Basilar artery:  Patent. Vertebral arteries:  Patent. No evidence of high-grade stenosis or large vessel occlusion. IMPRESSION: MRI head: 1. No acute intracranial abnormality identified. 2. Minimal chronic microvascular ischemic changes of white matter. MRA head: 1. 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 2. No large vessel occlusion or high-grade stenosis. Electronically Signed   By: Kristine Garbe M.D.   On: 05/17/2018 23:41   Mr Lumbar Spine Wo Contrast  Result Date: 05/18/2018 CLINICAL DATA:  57 y/o M; right leg weakness and transient blurred vision. EXAM: MRI LUMBAR SPINE WITHOUT CONTRAST TECHNIQUE: Multiplanar, multisequence MR imaging of the lumbar spine was performed. No intravenous contrast was administered. COMPARISON:  None. FINDINGS: Segmentation:  Standard. Alignment:  Physiologic. Vertebrae:  No fracture, evidence of discitis, or bone lesion. Conus medullaris and cauda equina: Conus extends to the L1 level. Conus and cauda equina appear normal. Paraspinal and other soft tissues: Negative. Disc levels: L1-2:  Mild disc bulge.  No significant foraminal or canal stenosis. L2-3: Mild disc bulge and facet  hypertrophy. No significant foraminal or canal stenosis. L3-4: Mild disc bulge and facet hypertrophy. Mild loss of intervertebral disc space height. No significant foraminal or canal stenosis. L4-5: Mild disc bulge and facet hypertrophy. Mild loss of intervertebral disc space height. No significant foraminal or canal stenosis. L5-S1: Mild disc bulge and facet hypertrophy. No significant foraminal or canal stenosis. IMPRESSION: 1. No acute osseous abnormality or malalignment. 2. Mild disc and facet degenerative changes of the lumbar spine. No significant foraminal or canal stenosis. Electronically Signed   By: Kristine Garbe M.D.   On: 05/18/2018 00:04   Mr Jodene Nam Head Wo Contrast  Result Date:  05/17/2018 CLINICAL DATA:  57 y/o M; right leg weakness and transient blurred vision. EXAM: MRI HEAD WITHOUT CONTRAST MRA HEAD WITHOUT CONTRAST TECHNIQUE: Multiplanar, multiecho pulse sequences of the brain and surrounding structures were obtained without intravenous contrast. Angiographic images of the head were obtained using MRA technique without contrast. COMPARISON:  05/17/2018 CT head FINDINGS: MRI HEAD FINDINGS Brain: No acute infarction, hemorrhage, hydrocephalus, extra-axial collection or mass lesion. Few foci of T2 FLAIR hyperintensity in bifrontal subcortical white matter compatible with minimal chronic microvascular ischemic changes. 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 Skull and upper cervical spine: Normal marrow signal. Sinuses/Orbits: Negative. Other: None. MRA HEAD FINDINGS Internal carotid arteries:  Patent. Anterior cerebral arteries: Patent. Large right A1 and anterior communicating artery, small left A1, normal variant. Middle cerebral arteries: Patent. Anterior communicating artery: Patent. 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 Posterior communicating arteries:  Patent. Posterior cerebral arteries:  Patent.  Fetal left PCA. Basilar artery:  Patent. Vertebral arteries:  Patent. No evidence of high-grade stenosis or large vessel occlusion. IMPRESSION: MRI head: 1. No acute intracranial abnormality identified. 2. Minimal chronic microvascular ischemic changes of white matter. MRA head: 1. 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 2. No large vessel occlusion or high-grade stenosis. Electronically Signed   By: Kristine Garbe M.D.   On: 05/17/2018 23:41   Ct Head Code Stroke Wo Contrast  Result Date: 05/17/2018 CLINICAL DATA:  Code stroke. RIGHT-sided facial droop with weakness. Last known normal 8 hours ago. EXAM: CT HEAD WITHOUT  CONTRAST TECHNIQUE: Contiguous axial images were obtained from the base of the skull through the vertex without intravenous contrast. COMPARISON:  None. FINDINGS: Brain: No evidence of acute infarction, hemorrhage, hydrocephalus, extra-axial collection or mass lesion/mass effect. Vascular: No hyperdense vessel or unexpected calcification. Skull: Normal. Negative for fracture or focal lesion. Sinuses/Orbits: No acute finding. Other: None. ASPECTS [facility] Stroke Program Early CT Score) - Ganglionic level infarction (caudate, lentiform nuclei, internal capsule, insula, M1-M3 cortex): 7 - Supraganglionic infarction (M4-M6 cortex): 3 Total score (0-10 with 10 being normal): 10 IMPRESSION: 1. Normal CT head. 2. ASPECTS is 10. These results were communicated to Dr. Cheral Marker at [place] 7/15/2019by text page via the [facility] messaging system. Electronically Signed   By: Staci Righter M.D.   On: 05/17/2018 20:13    Labs:  CBC: Recent Labs    05/17/18 1957 05/17/18 2017  WBC 8.0  --   HGB 13.0 13.3  HCT 39.1 39.0  PLT 266  --     COAGS: Recent Labs    05/17/18 1957  INR 0.95  APTT 29    BMP: Recent Labs    05/17/18 1957 05/17/18 2017  NA 139 139  K 3.6 3.7  CL 104 102  CO2  26  --   GLUCOSE 110* 108*  BUN 15 17  CALCIUM 9.3  --   CREATININE 1.03 1.00  GFRNONAA >60  --   GFRAA >60  --     LIVER FUNCTION TESTS: Recent Labs    05/17/18 1957  BILITOT 0.5  AST 28  ALT 19  ALKPHOS 48  PROT 7.3  ALBUMIN 4.3    TUMOR MARKERS: No results for input(s): AFPTM, CEA, CA199, CHROMGRNA in the last 8760 hours.  Assessment and Plan:  Long discussion with Dr Estanislado Pandy regarding possible treatment of R ACA aneurysm Discussed cerebral arteriogram and possible embolization to be performed same day. Risks and benefits including but not limited to infection; bleeding; vessel damage; aneurysm rupture; stroke; death. All questions were answered to satisfaction. He understands all risks and  benefits and would like to proceed He has been given Rx Plavix 75 mg #30 to start with ASA 325 mg daily after procedure is scheduled. These meds must be started 7 days prior to procedure. He will call scheduler when he is ready to set up appointment. Pt and wife have good understanding of this plan.  Thank you for this interesting consult.  I greatly enjoyed meeting Gianfranco Araki Reyburn and look forward to participating in their care.  A copy of this report was sent to the requesting provider on this date.  Electronically Signed: Lavonia Drafts, PA-C 05/27/2018, 10:42 AM   I spent a total of  60 Minutes   in face to face in clinical consultation, greater than 50% of which was counseling/coordinating care for R ACA aneurysm

## 2018-05-28 ENCOUNTER — Other Ambulatory Visit (HOSPITAL_COMMUNITY): Payer: Self-pay | Admitting: Interventional Radiology

## 2018-06-01 ENCOUNTER — Ambulatory Visit (INDEPENDENT_AMBULATORY_CARE_PROVIDER_SITE_OTHER): Payer: 59 | Admitting: Neurology

## 2018-06-01 DIAGNOSIS — M21371 Foot drop, right foot: Secondary | ICD-10-CM | POA: Diagnosis not present

## 2018-06-01 DIAGNOSIS — G5731 Lesion of lateral popliteal nerve, right lower limb: Secondary | ICD-10-CM

## 2018-06-01 NOTE — Procedures (Signed)
[facility]  [place], [place]  [place], [place] [address] Tel: [phone] Fax:  [phone] Test Date:  06/01/2018  Patient: Douglas Mathews DOB: 10/03/1961 Physician: Narda Amber, DO  Sex: Male Height: 5\' 10"  Ref Phys: Bonnielee Haff, MD  ID#: [id number] Temp: 34.6C Technician:    Patient Complaints: This is a 57 year old man with 2-week history of right leg weakness and numbness referred for evaluation of foot drop.  NCV & EMG Findings: Extensive electrodiagnostic testing of the right lower extremity shows:  1. Right superficial peroneal sensory response is asymmetrically reduced when compared to the left, despite being within normal limits. Bilateral sural sensory responses are within normal limits. 2. Right peroneal motor response shows prolonged latency (6.6 ms) and asymmetrically reduced amplitude (1.1 mV) at the extensor digitorum brevis and is asymmetrically reduced at the tibialis anterior.  Right peroneal motor conduction velocity is within normal limits. Right tibial motor responses within normal limits. 3. Right tibial H reflex study is within normal limits. 4. Rapid recruitment pattern is seen in the right extensor digitorum brevis and variable firing pattern is seen in the flexor digitorum longus muscle, which is of unclear clinical significance. Recruitment pattern in the tibialis anterior, biceps femoris short head, and gluteus medius muscles is normal. There is no evidence of active denervation any of the tested muscles.  Impression: The results of the above study are limited as optimal timing for definitive study is at least 3-4 weeks from symptom onset.  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   Recommend repeat electrodiagnostic testing in 4-8 weeks, if clinically indicated.     ___________________________ Narda Amber, DO    Nerve Conduction Studies Anti Sensory Summary Table   Site NR Peak (ms) Norm Peak (ms) P-T Amp (V) Norm P-T Amp  Left Sup Peroneal Anti Sensory (Ant Lat Mall)  12 cm    2.3 <4.6 19.8 >4  Right Sup Peroneal Anti Sensory (Ant Lat Mall)  12 cm    3.6 <4.6 9.0 >4  Left Sural Anti Sensory (Lat Mall)  Calf    3.8 <4.6 18.6 >4  Right Sural Anti Sensory (Lat Mall)  Calf    4.1 <4.6 13.6 >4   Motor Summary Table   Site NR Onset (ms) Norm Onset (ms) O-P Amp (mV) Norm O-P Amp Site1 Site2 Delta-0 (ms) Dist (cm) Vel (m/s) Norm Vel (m/s)  Left Peroneal Motor (Ext Dig Brev)  Ankle    4.7 <6.0 2.8 >2.5 B Fib Ankle 7.9 35.0 44 >40  B Fib    12.6  2.0  Poplt B Fib 1.4 10.0 71 >40  Poplt    14.0  2.0         Right Peroneal Motor (Ext Dig Brev)  Ankle    6.6 <6.0 1.1 >2.5 B Fib Ankle 10.0 40.0 40 >40  B Fib    16.6  0.7  Poplt B Fib 2.2 10.0 45 >40  Poplt    18.8  0.7         Left Peroneal TA Motor (Tib Ant)  Fib Head    3.8 <4.5 7.3 >3 Poplit Fib Head 1.4 10.0 71 >40  Poplit    5.2  7.1         Right Peroneal TA Motor (Tib Ant)  Fib Head    3.9 <4.5 4.5 >3 Poplit Fib Head 1.3 10.0 77 >40  Poplit    5.2  4.3         Right Tibial Motor (Abd Hall Brev)  Ankle    5.2 <6.0 11.6 >4 Knee Ankle 9.5 42.0 44 >40  Knee    14.7  8.4          H Reflex Studies   NR H-Lat (ms) Lat Norm (ms) L-R H-Lat (ms)  Right Tibial (Gastroc)     31.70 <35    EMG   Side Muscle Ins Act Fibs Psw Fasc Number Recrt Dur Dur. Amp Amp. Poly Poly. Comment  Right AntTibialis Nml Nml Nml Nml Nml Nml Nml Nml Nml Nml Nml Nml N/A  Right Gastroc Nml Nml Nml Nml Nml Nml Nml Nml Nml Nml Nml Nml N/A  Right Flex Dig Long Nml Nml Nml Nml 2- Mod-V Nml Nml Nml Nml Nml Nml N/A  Right Ext Dig Brev Nml Nml Nml Nml SMU Rapid Nml Nml Nml Nml Nml Nml N/A  Right RectFemoris Nml Nml Nml Nml Nml Nml Nml Nml Nml Nml Nml Nml N/A  Right Fibularis Long Nml Nml Nml Nml Nml Nml Nml Nml Nml Nml Nml Nml N/A  Right GluteusMed Nml Nml  Nml Nml Nml Nml Nml Nml Nml Nml Nml Nml N/A  Right Lumbo Parasp Low Nml Nml Nml Nml NE - - - - - - - N/A      Waveforms:

## 2018-06-07 ENCOUNTER — Telehealth: Payer: Self-pay | Admitting: Neurology

## 2018-06-07 NOTE — Telephone Encounter (Signed)
Patient called and wanted the results for recent EMG with Dr. Posey Pronto. Thanks.

## 2018-06-08 ENCOUNTER — Encounter: Payer: Self-pay | Admitting: Family Medicine

## 2018-06-08 NOTE — Telephone Encounter (Signed)
Patient instructed to call the referring doctor for the results.

## 2018-06-11 ENCOUNTER — Telehealth: Payer: Self-pay | Admitting: Neurology

## 2018-06-11 NOTE — Telephone Encounter (Signed)
Patient has been trying to reach the referring [facility] office to get his test results and no one will return his call. Please Advise. Thanks

## 2018-06-11 NOTE — Telephone Encounter (Signed)
Note sent via My Chart for patient to have PCP send a referral.

## 2018-06-14 ENCOUNTER — Other Ambulatory Visit: Payer: Self-pay | Admitting: Family Medicine

## 2018-06-14 DIAGNOSIS — G629 Polyneuropathy, unspecified: Secondary | ICD-10-CM

## 2018-06-15 ENCOUNTER — Telehealth: Payer: Self-pay | Admitting: Neurology

## 2018-06-15 NOTE — Telephone Encounter (Signed)
Patient was offered app ton 06-16-18 and 06-17-18 to see Dr Posey Pronto. He states that he has appt on 07-23-18 at [facility] and will wait to see what he is told there. I let him know if he needed [place] to please call us back at [phone].

## 2018-06-21 ENCOUNTER — Encounter: Payer: Self-pay | Admitting: Family Medicine

## 2018-07-04 HISTORY — PX: CEREBRAL ANEURYSM REPAIR: SHX164

## 2018-07-06 ENCOUNTER — Telehealth (HOSPITAL_COMMUNITY): Payer: Self-pay

## 2018-07-06 NOTE — Telephone Encounter (Signed)
Pt will call and schedule when he is ready. AW

## 2018-07-09 MED ORDER — FENTANYL CITRATE (PF) 50 MCG/ML IJ SOLN
12.50 | INTRAMUSCULAR | Status: DC
Start: ? — End: 2018-07-09

## 2018-07-09 MED ORDER — LEVETIRACETAM 500 MG PO TABS
1000.00 | ORAL_TABLET | ORAL | Status: DC
Start: 2018-07-08 — End: 2018-07-09

## 2018-07-09 MED ORDER — OXYCODONE HCL 5 MG PO TABS
5.00 | ORAL_TABLET | ORAL | Status: DC
Start: ? — End: 2018-07-09

## 2018-07-09 MED ORDER — ACETAMINOPHEN 325 MG PO TABS
975.00 | ORAL_TABLET | ORAL | Status: DC
Start: 2018-07-09 — End: 2018-07-09

## 2018-07-09 MED ORDER — SENNOSIDES-DOCUSATE SODIUM 8.6-50 MG PO TABS
2.00 | ORAL_TABLET | ORAL | Status: DC
Start: ? — End: 2018-07-09

## 2018-07-09 MED ORDER — PANTOPRAZOLE SODIUM 40 MG PO TBEC
40.00 | DELAYED_RELEASE_TABLET | ORAL | Status: DC
Start: 2018-07-09 — End: 2018-07-09

## 2018-07-09 MED ORDER — ONDANSETRON HCL 4 MG/2ML IJ SOLN
4.00 | INTRAMUSCULAR | Status: DC
Start: ? — End: 2018-07-09

## 2018-07-09 MED ORDER — ENOXAPARIN SODIUM 40 MG/0.4ML ~~LOC~~ SOLN
40.00 | SUBCUTANEOUS | Status: DC
Start: 2018-07-09 — End: 2018-07-09

## 2018-07-09 MED ORDER — DEXAMETHASONE 0.5 MG PO TABS
1.00 | ORAL_TABLET | ORAL | Status: DC
Start: 2018-07-09 — End: 2018-07-09

## 2018-07-11 ENCOUNTER — Encounter: Payer: Self-pay | Admitting: Family Medicine

## 2018-09-10 ENCOUNTER — Ambulatory Visit (INDEPENDENT_AMBULATORY_CARE_PROVIDER_SITE_OTHER): Payer: 59 | Admitting: Primary Care

## 2018-09-10 ENCOUNTER — Encounter: Payer: Self-pay | Admitting: Primary Care

## 2018-09-10 VITALS — BP 118/80 | HR 71 | Temp 98.0°F | Ht 66.0 in | Wt 176.5 lb

## 2018-09-10 DIAGNOSIS — J069 Acute upper respiratory infection, unspecified: Secondary | ICD-10-CM | POA: Diagnosis not present

## 2018-09-10 NOTE — Patient Instructions (Signed)
Your symptoms are representative of a viral illness which will resolve on its own over time. Our goal is to treat your symptoms in order to aid your body in the healing process and to make you more comfortable.   You may try Delsym DM or Robitussin DM for cough/congestion.  Nasal Congestion/Ear Pressure: Try using Flonase (fluticasone) nasal spray. Instill 1 spray in each nostril twice daily.   You can try Zyrtec for throat drainage, runny nose.  Please notify me if you develop persistent fevers of 101, or feel worse after 1 week of onset of symptoms.   Increase consumption of water intake and rest.  It was a pleasure meeting you!   Upper Respiratory Infection, Adult Most upper respiratory infections (URIs) are caused by a virus. A URI affects the nose, throat, and upper air passages. The most common type of URI is often called "the common cold." Follow these instructions at home:  Take medicines only as told by your doctor.  Gargle warm saltwater or take cough drops to comfort your throat as told by your doctor.  Use a warm mist humidifier or inhale steam from a shower to increase air moisture. This may make it easier to breathe.  Drink enough fluid to keep your pee (urine) clear or pale yellow.  Eat soups and other clear broths.  Have a healthy diet.  Rest as needed.  Go back to work when your fever is gone or your doctor says it is okay. ? You may need to stay home longer to avoid giving your URI to others. ? You can also wear a face mask and wash your hands often to prevent spread of the virus.  Use your inhaler more if you have asthma.  Do not use any tobacco products, including cigarettes, chewing tobacco, or electronic cigarettes. If you need help quitting, ask your doctor. Contact a doctor if:  You are getting worse, not better.  Your symptoms are not helped by medicine.  You have chills.  You are getting more short of breath.  You have brown or red  mucus.  You have yellow or brown discharge from your nose.  You have pain in your face, especially when you bend forward.  You have a fever.  You have puffy (swollen) neck glands.  You have pain while swallowing.  You have white areas in the back of your throat. Get help right away if:  You have very bad or constant: ? Headache. ? Ear pain. ? Pain in your forehead, behind your eyes, and over your cheekbones (sinus pain). ? Chest pain.  You have long-lasting (chronic) lung disease and any of the following: ? Wheezing. ? Long-lasting cough. ? Coughing up blood. ? A change in your usual mucus.  You have a stiff neck.  You have changes in your: ? Vision. ? Hearing. ? Thinking. ? Mood. This information is not intended to replace advice given to you by your health care provider. Make sure you discuss any questions you have with your health care provider. Document Released: 04/07/2008 Document Revised: 06/22/2016 Document Reviewed: 01/25/2014 Elsevier Interactive Patient Education  2018 [employer].

## 2018-09-10 NOTE — Progress Notes (Signed)
Subjective:    Patient ID: Douglas Mathews, male    DOB: [birth year], 57 y.o.   MRN: [id number]  HPI  Douglas Mathews is a 57 year old male with a history of hypertension, allergic rhinitis who presents today with a chief complaint of cough.  He also reports body aches, chills, night sweats, congestion, post nasal drip. His symptoms began 2 days ago. He ran a low grade fever of 99.0 last night. Took Tylenol last night and felt his fever break.  He's not taken anything else OTC for his symptoms. He denies sick contacts.   Review of Systems  Constitutional: Positive for chills and fever.  HENT: Positive for congestion and postnasal drip. Negative for sore throat.   Respiratory: Positive for cough. Negative for shortness of breath.   Cardiovascular: Negative for chest pain.       Past Medical History:  Diagnosis Date  . GI bleed    due to bleeding polyp  . History of MRI 03/2004   head, nml [name])  . Hypertension   . Rash    on buttock, respods to clobetasol     Social History   Socioeconomic History  . Marital status: Married    Spouse name: Not on file  . Number of children: 2  . Years of education: Not on file  . Highest education level: Not on file  Occupational History  . Occupation: Hydrologist: [employer]  . Financial resource strain: Not on file  . Food insecurity:    Worry: Not on file    Inability: Not on file  . Transportation needs:    Medical: Not on file    Non-medical: Not on file  Tobacco Use  . Smoking status: Never Smoker  . Smokeless tobacco: Never Used  Substance and Sexual Activity  . Alcohol use: Yes    Comment: 6 pack per week  . Drug use: No  . Sexual activity: Not on file  Lifestyle  . Physical activity:    Days per week: Not on file    Minutes per session: Not on file  . Stress: Not on file  Relationships  . Social connections:    Talks on phone: Not on file    Gets together: Not on file   Attends religious service: Not on file    Active member of club or organization: Not on file    Attends meetings of clubs or organizations: Not on file    Relationship status: Not on file  . Intimate partner violence:    Fear of current or ex partner: Not on file    Emotionally abused: Not on file    Physically abused: Not on file    Forced sexual activity: Not on file  Other Topics Concern  . Not on file  Social History Narrative   Divorced 2010   Remarried 2013   2 biological daughters, 2 step daughters    Event organiser    Past Surgical History:  Procedure Laterality Date  . CEREBRAL ANEURYSM REPAIR  07/2018   comm aneurysm clipping done at [facility] 2019  . [facility])  06/07-06/07/2009   Anemia due to blood loss hypokalemia Erythr prophyria GI bleed from polyp  . VASECTOMY  04/03/00   [facility])    Family History  Problem Relation Age of Onset  . Heart disease Father        Afib  . Prostate cancer Neg Hx   .  Colon cancer Neg Hx     No Known Allergies  Current Outpatient Medications on File Prior to Visit  Medication Sig Dispense Refill  . amLODipine (NORVASC) 5 MG tablet Take 1 tablet (5 mg total) by mouth daily. 90 tablet 3  . lisinopril (PRINIVIL,ZESTRIL) 20 MG tablet Take by mouth.     No current facility-administered medications on file prior to visit.     BP 118/80   Pulse 71   Temp 98 F (36.7 C) (Oral)   Ht 5\' 6"  (1.676 m)   Wt 176 lb 8 oz (80.1 kg)   SpO2 99%   BMI 28.49 kg/m    Objective:   Physical Exam  Constitutional: He appears well-nourished. He does not appear ill.  HENT:  Right Ear: Tympanic membrane and ear canal normal.  Left Ear: Tympanic membrane and ear canal normal.  Nose: No mucosal edema. Right sinus exhibits no maxillary sinus tenderness and no frontal sinus tenderness. Left sinus exhibits no maxillary sinus tenderness and no frontal sinus tenderness.  Mouth/Throat: Oropharynx is clear and moist.  Neck: Neck supple.    Cardiovascular: Normal rate and regular rhythm.  Respiratory: Effort normal and breath sounds normal. He has no wheezes.  Skin: Skin is warm and dry.           Assessment & Plan:  Viral URI:  Cough, low grade fever, body aches, etc x 2 days. Vitals today WNL. Doesn't appear sickly, no suspicion for influenza.  Suspect viral URI and will treat with conservative measures. Continue Tylenol PRN for headaches, fever, body aches. Discussed use of Delsym/Robitussin, Zyrtec, Flonase. Return precautions provided.  Pleas Koch, NP

## 2018-10-12 DIAGNOSIS — M79671 Pain in right foot: Secondary | ICD-10-CM | POA: Insufficient documentation

## 2018-10-21 ENCOUNTER — Ambulatory Visit (INDEPENDENT_AMBULATORY_CARE_PROVIDER_SITE_OTHER): Payer: 59

## 2018-10-21 DIAGNOSIS — Z23 Encounter for immunization: Secondary | ICD-10-CM

## 2018-11-05 ENCOUNTER — Other Ambulatory Visit: Payer: Self-pay | Admitting: Family Medicine

## 2018-11-05 ENCOUNTER — Other Ambulatory Visit (INDEPENDENT_AMBULATORY_CARE_PROVIDER_SITE_OTHER): Payer: 59

## 2018-11-05 DIAGNOSIS — I10 Essential (primary) hypertension: Secondary | ICD-10-CM

## 2018-11-05 DIAGNOSIS — Z125 Encounter for screening for malignant neoplasm of prostate: Secondary | ICD-10-CM

## 2018-11-05 LAB — PSA: PSA: 0.23 ng/mL (ref 0.10–4.00)

## 2018-11-05 LAB — CBC WITH DIFFERENTIAL/PLATELET
Basophils Absolute: 0.1 10*3/uL (ref 0.0–0.1)
Basophils Relative: 1.2 % (ref 0.0–3.0)
EOS PCT: 2.2 % (ref 0.0–5.0)
Eosinophils Absolute: 0.1 10*3/uL (ref 0.0–0.7)
HEMATOCRIT: 39.3 % (ref 39.0–52.0)
Hemoglobin: 13.2 g/dL (ref 13.0–17.0)
Lymphocytes Relative: 23.7 % (ref 12.0–46.0)
Lymphs Abs: 1.3 10*3/uL (ref 0.7–4.0)
MCHC: 33.6 g/dL (ref 30.0–36.0)
MCV: 84.9 fl (ref 78.0–100.0)
Monocytes Absolute: 0.5 10*3/uL (ref 0.1–1.0)
Monocytes Relative: 9 % (ref 3.0–12.0)
Neutro Abs: 3.6 10*3/uL (ref 1.4–7.7)
Neutrophils Relative %: 63.9 % (ref 43.0–77.0)
Platelets: 213 10*3/uL (ref 150.0–400.0)
RBC: 4.64 Mil/uL (ref 4.22–5.81)
RDW: 14.9 % (ref 11.5–15.5)
WBC: 5.6 10*3/uL (ref 4.0–10.5)

## 2018-11-05 LAB — COMPREHENSIVE METABOLIC PANEL
ALT: 24 U/L (ref 0–53)
AST: 22 U/L (ref 0–37)
Albumin: 4.3 g/dL (ref 3.5–5.2)
Alkaline Phosphatase: 44 U/L (ref 39–117)
BUN: 19 mg/dL (ref 6–23)
CO2: 29 mEq/L (ref 19–32)
Calcium: 9.2 mg/dL (ref 8.4–10.5)
Chloride: 103 mEq/L (ref 96–112)
Creatinine, Ser: 1.01 mg/dL (ref 0.40–1.50)
GFR: 81.46 mL/min (ref >60.00)
Glucose, Bld: 85 mg/dL (ref 70–99)
POTASSIUM: 4.2 meq/L (ref 3.5–5.1)
Sodium: 139 mEq/L (ref 135–145)
Total Bilirubin: 0.4 mg/dL (ref 0.2–1.2)
Total Protein: 6.6 g/dL (ref 6.0–8.3)

## 2018-11-05 LAB — LIPID PANEL
Cholesterol: 166 mg/dL (ref 0–200)
HDL: 53.2 mg/dL (ref >39.00)
LDL Cholesterol: 97 mg/dL (ref 0–99)
NonHDL: 112.78
TRIGLYCERIDES: 80 mg/dL (ref 0.0–149.0)
Total CHOL/HDL Ratio: 3
VLDL: 16 mg/dL (ref 0.0–40.0)

## 2018-11-09 ENCOUNTER — Encounter: Payer: Self-pay | Admitting: Family Medicine

## 2018-11-09 ENCOUNTER — Ambulatory Visit (INDEPENDENT_AMBULATORY_CARE_PROVIDER_SITE_OTHER): Payer: 59 | Admitting: Family Medicine

## 2018-11-09 VITALS — BP 96/74 | HR 71 | Temp 98.1°F | Ht 66.0 in | Wt 177.5 lb

## 2018-11-09 DIAGNOSIS — I671 Cerebral aneurysm, nonruptured: Secondary | ICD-10-CM

## 2018-11-09 DIAGNOSIS — Z0000 Encounter for general adult medical examination without abnormal findings: Secondary | ICD-10-CM | POA: Diagnosis not present

## 2018-11-09 DIAGNOSIS — Z7189 Other specified counseling: Secondary | ICD-10-CM

## 2018-11-09 DIAGNOSIS — I10 Essential (primary) hypertension: Secondary | ICD-10-CM

## 2018-11-09 NOTE — Patient Instructions (Addendum)
Take your BP cuff to your next appointment to calibrate it.  If your BP is consistently <120/<80, then cut the lisinopril in half (down to 10mg  a day).  Update me as needed.  Take care.  Glad to see you.

## 2018-11-09 NOTE — Progress Notes (Signed)
CPE- See plan.  Routine anticipatory guidance given to patient.  See health maintenance.  The possibility exists that previously documented standard health maintenance information may have been brought forward from a previous encounter into this note.  If needed, that same information has been updated to reflect the current situation based on today's encounter.    Tetanus 2012  Flu 2019.   PNA and shingles not due  Colonoscopy 01/19/2018 PSA wnl.  Prostate cancer screening and PSA options (with potential risks and benefits of testing vs not testing) were discussed along with recent recs/guidelines.  He elects to keep testing PSA yearly.   Living will d/w pt. Wife designated if patient were incapacitated.  Diet and exercise. D/w pt. HCV neg 05/24/14 at [facility].  HIV screening prev done.    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  He/ll have f/u with [facility], I'll defer.  No new neurologic symptoms.  He still has a h/o R foot drop, has seen ortho.  Prev w/u d/w pt.  Improved in the meantime, with minimal residual weakness.  He has been hiking and doing well.  Hypertension:    Using medication without problems or lightheadedness: yes Chest pain with exertion: no Edema:no Short of breath:no He checked his BP last night at home and has been higher on home check.   He had occ taking an extra dose of lisinopril.    PMH and SH reviewed  Meds, vitals, and allergies reviewed.   ROS: Per HPI.  Unless specifically indicated otherwise in HPI, the patient denies:  General: fever. Eyes: acute vision changes ENT: sore throat Cardiovascular: chest pain Respiratory: SOB GI: vomiting GU: dysuria Musculoskeletal: acute back pain Derm: acute rash Neuro: acute motor dysfunction Psych: worsening mood Endocrine: polydipsia Heme: bleeding Allergy: hayfever  GEN: nad, alert and oriented HEENT: mucous membranes moist NECK: supple w/o LA CV: rrr. PULM: ctab, no inc  wob ABD: soft, +bs EXT: no edema SKIN: no acute rash Minimal weakness of R 1st toe dorsiflexion.

## 2018-11-10 DIAGNOSIS — I671 Cerebral aneurysm, nonruptured: Secondary | ICD-10-CM | POA: Insufficient documentation

## 2018-11-10 NOTE — Assessment & Plan Note (Signed)
Blood pressures have been normal on home check.  Discussed with patient about options.  Labs discussed with patient. He can take his BP cuff to next appointment to calibrate it.  If BP is consistently <120/<80, then cut the lisinopril in half (down to 10mg  a day).  Update me as needed.  He agrees with plan. Diet and exercise discussed with patient.

## 2018-11-10 NOTE — Assessment & Plan Note (Signed)
  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  He/ll have f/u with [facility], I'll defer.  No new neurologic symptoms.  He still has a h/o R foot drop, has seen ortho.  Prev w/u d/w pt.  Improved in the meantime, with minimal residual weakness.  He has been hiking and doing well.

## 2018-11-10 NOTE — Assessment & Plan Note (Signed)
Tetanus 2012  Flu 2019.   PNA and shingles not due  Colonoscopy 01/19/2018 PSA wnl.  Prostate cancer screening and PSA options (with potential risks and benefits of testing vs not testing) were discussed along with recent recs/guidelines.  He elects to keep testing PSA yearly.   Living will d/w pt. Wife designated if patient were incapacitated.  Diet and exercise. D/w pt. HCV neg 05/24/14 at [facility].  HIV screening prev done.

## 2018-11-10 NOTE — Assessment & Plan Note (Signed)
Wife designated if patient were incapacitated.  

## 2019-02-07 ENCOUNTER — Encounter: Payer: Self-pay | Admitting: Family Medicine

## 2019-02-07 NOTE — Telephone Encounter (Signed)
Patient left a voicemail requesting that this medication be sent to the pharmacy for him.

## 2019-02-08 ENCOUNTER — Other Ambulatory Visit: Payer: Self-pay | Admitting: Family Medicine

## 2019-02-08 MED ORDER — FLUTICASONE PROPIONATE 50 MCG/ACT NA SUSP: 2.0000 | Freq: Every day | NASAL | 12 refills | Status: DC

## 2019-05-10 ENCOUNTER — Other Ambulatory Visit: Payer: Self-pay | Admitting: Family Medicine

## 2019-05-10 NOTE — Telephone Encounter (Signed)
I do not see where this has actually been filled just a note in last OV from 11/2018 see below  If BP is consistently <120/<80, then cut the lisinopril in half (down to 10mg  a day).  Update me as needed.  He agrees with plan. Diet and exercise discussed with patient.  Please advise

## 2019-05-12 ENCOUNTER — Encounter: Payer: Self-pay | Admitting: Family Medicine

## 2019-05-12 MED ORDER — LISINOPRIL 20 MG PO TABS: 20.0000 mg | ORAL_TABLET | Freq: Every day | ORAL | 0 refills | Status: DC

## 2019-05-15 NOTE — Telephone Encounter (Signed)
It looks like this was already sent in. Please verify current total daily dose and I can send in longer-term prescription if needed.  Thanks.

## 2019-05-16 NOTE — Telephone Encounter (Signed)
Patient says pharmacy now says the Rx is ready for pickup.

## 2019-07-29 ENCOUNTER — Other Ambulatory Visit: Payer: Self-pay | Admitting: *Deleted

## 2019-07-29 ENCOUNTER — Encounter: Payer: Self-pay | Admitting: Family Medicine

## 2019-07-29 MED ORDER — LISINOPRIL 20 MG PO TABS: 20.0000 mg | ORAL_TABLET | Freq: Every day | ORAL | 0 refills | Status: DC

## 2019-08-18 ENCOUNTER — Telehealth: Payer: Self-pay | Admitting: Family Medicine

## 2019-08-18 NOTE — Telephone Encounter (Signed)
Ok to schedule for nurse visit?

## 2019-08-18 NOTE — Telephone Encounter (Signed)
appt scheduled

## 2019-08-18 NOTE — Telephone Encounter (Signed)
Yes please for both Tdap and flu shot.  I thank the patient for checking on this.  And congrats on the pending birth.

## 2019-08-18 NOTE — Telephone Encounter (Signed)
Best number [phone]  Pt called wanting to schedule flu shot and tdap injection.  grandbaby due 11/25.

## 2019-08-23 ENCOUNTER — Ambulatory Visit (INDEPENDENT_AMBULATORY_CARE_PROVIDER_SITE_OTHER): Payer: 59

## 2019-08-23 DIAGNOSIS — Z23 Encounter for immunization: Secondary | ICD-10-CM | POA: Diagnosis not present

## 2019-08-23 NOTE — Progress Notes (Signed)
Per orders of Dr. Damita Dunnings, injection of Tdap  given by Kris Mouton. Patient tolerated injection well.  Flu vaccine was administered by Varney Daily, CMA

## 2019-10-22 ENCOUNTER — Emergency Department: Payer: No Typology Code available for payment source

## 2019-10-22 ENCOUNTER — Emergency Department
Admission: EM | Admit: 2019-10-22 | Discharge: 2019-10-22 | Disposition: A | Discharge status: Home / Self Care | Payer: No Typology Code available for payment source | Attending: Student in an Organized Health Care Education/Training Program | Admitting: Student in an Organized Health Care Education/Training Program

## 2019-10-22 ENCOUNTER — Encounter: Payer: Self-pay | Admitting: Radiology

## 2019-10-22 ENCOUNTER — Other Ambulatory Visit: Payer: Self-pay

## 2019-10-22 DIAGNOSIS — R110 Nausea: Secondary | ICD-10-CM | POA: Insufficient documentation

## 2019-10-22 DIAGNOSIS — Z79899 Other long term (current) drug therapy: Secondary | ICD-10-CM | POA: Diagnosis not present

## 2019-10-22 DIAGNOSIS — R0602 Shortness of breath: Secondary | ICD-10-CM | POA: Diagnosis not present

## 2019-10-22 DIAGNOSIS — I10 Essential (primary) hypertension: Secondary | ICD-10-CM | POA: Insufficient documentation

## 2019-10-22 DIAGNOSIS — R55 Syncope and collapse: Secondary | ICD-10-CM | POA: Diagnosis present

## 2019-10-22 DIAGNOSIS — M542 Cervicalgia: Secondary | ICD-10-CM

## 2019-10-22 LAB — CBC
HCT: 40.4 % (ref 39.0–52.0)
Hemoglobin: 13.8 g/dL (ref 13.0–17.0)
MCH: 27.8 pg (ref 26.0–34.0)
MCHC: 34.2 g/dL (ref 30.0–36.0)
MCV: 81.5 fL (ref 80.0–100.0)
Platelets: 293 10*3/uL (ref 150–400)
RBC: 4.96 MIL/uL (ref 4.22–5.81)
RDW: 13.6 % (ref 11.5–15.5)
WBC: 18.1 10*3/uL — ABNORMAL HIGH (ref 4.0–10.5)
nRBC: 0 % (ref 0.0–0.2)

## 2019-10-22 LAB — BASIC METABOLIC PANEL
Anion gap: 12 (ref 5–15)
BUN: 22 mg/dL — ABNORMAL HIGH (ref 6–20)
CO2: 25 mmol/L (ref 22–32)
Calcium: 9.4 mg/dL (ref 8.9–10.3)
Chloride: 101 mmol/L (ref 98–111)
Creatinine, Ser: 1.15 mg/dL (ref 0.61–1.24)
GFR calc Af Amer: >60 mL/min (ref >60)
GFR calc non Af Amer: >60 mL/min (ref >60)
Glucose, Bld: 121 mg/dL — ABNORMAL HIGH (ref 70–99)
Potassium: 3.5 mmol/L (ref 3.5–5.1)
Sodium: 138 mmol/L (ref 135–145)

## 2019-10-22 LAB — HEPATIC FUNCTION PANEL
ALT: 23 U/L (ref 0–44)
AST: 38 U/L (ref 15–41)
Albumin: 4.6 g/dL (ref 3.5–5.0)
Alkaline Phosphatase: 47 U/L (ref 38–126)
Bilirubin, Direct: <0.1 mg/dL (ref 0.0–0.2)
Total Bilirubin: 0.7 mg/dL (ref 0.3–1.2)
Total Protein: 7.6 g/dL (ref 6.5–8.1)

## 2019-10-22 LAB — TROPONIN I (HIGH SENSITIVITY)
Troponin I (High Sensitivity): 3 ng/L (ref <18)
Troponin I (High Sensitivity): 4 ng/L (ref <18)

## 2019-10-22 LAB — LIPASE, BLOOD: Lipase: 35 U/L (ref 11–51)

## 2019-10-22 MED ORDER — MORPHINE SULFATE (PF) 4 MG/ML IV SOLN
4.0000 mg | INTRAVENOUS | Status: DC | PRN
  Filled 2019-10-22: qty 1

## 2019-10-22 MED ORDER — ONDANSETRON HCL 4 MG/2ML IJ SOLN
4.0000 mg | Freq: Once | INTRAMUSCULAR | Status: DC
  Filled 2019-10-22: qty 2

## 2019-10-22 MED ORDER — ONDANSETRON HCL 4 MG/2ML IJ SOLN
4.0000 mg | Freq: Once | INTRAMUSCULAR | Status: AC
  Administered 2019-10-22: 4 mg via INTRAVENOUS
  Filled 2019-10-22: qty 2

## 2019-10-22 MED ORDER — SODIUM CHLORIDE 0.9 % IV BOLUS
500.0000 mL | Freq: Once | INTRAVENOUS | Status: AC
  Administered 2019-10-22: 500 mL via INTRAVENOUS

## 2019-10-22 MED ORDER — IOHEXOL 350 MG/ML SOLN
75.0000 mL | Freq: Once | INTRAVENOUS | Status: AC | PRN
  Administered 2019-10-22: 75 mL via INTRAVENOUS

## 2019-10-22 NOTE — ED Notes (Signed)
Pt given coffee and saltine crackers.

## 2019-10-22 NOTE — ED Notes (Addendum)
This RN asked pt if he was in any pain at this time. Pt states, "I am in a comfortable position." Rates pain 4/10. Informed pt that if he has any increase in pain to press call bell. Pt states he is also no longer nauseous

## 2019-10-22 NOTE — ED Notes (Signed)
States feels better, resting in recliner.

## 2019-10-22 NOTE — ED Provider Notes (Signed)
[facility] Emergency Department Provider Note    First MD Initiated Contact with Patient 10/22/19 [phone]     (approximate)  I have reviewed the triage vital signs and the nursing notes.   HISTORY  Chief Complaint Loss of Consciousness    HPI Douglas Mathews is a 58 y.o. male presents the ER for near syncopal episode associated nausea as well as neck pain.  This occurred last night early in the morning.  Patient admits to drinking 4 beers last night.  States he started feeling nausea got up with start feeling lightheaded.  Did fall hitting his head is complaining of right neck pain is traveling down into his back.  Has had a "crick's "in his neck but this 1 feels more severe.  States he is also feeling very tremulous during this episode almost like he was having a panic attack.  States his symptoms are feeling more improved right now.  States he has a mild headache but states that he would have already had coffee this morning thinks is related to that.  No numbness or tingling.    Past Medical History:  Diagnosis Date  . GI bleed    due to bleeding polyp  . History of MRI 03/2004   head, nml [name])  . Hypertension   . Rash    on buttock, respods to clobetasol   Family History  Problem Relation Age of Onset  . Heart disease Father        Afib  . Heart failure Father   . Colon cancer Maternal Uncle        possible hx  . Prostate cancer Neg Hx    Past Surgical History:  Procedure Laterality Date  . CEREBRAL ANEURYSM REPAIR  07/2018   comm aneurysm clipping done at [facility] 2019  . [facility])  06/07-06/07/2009   Anemia due to blood loss hypokalemia Erythr prophyria GI bleed from polyp  . VASECTOMY  04/03/00   [facility])   Patient Active Problem List   Diagnosis Date Noted  . Cerebral aneurysm 11/10/2018  . Lower urinary tract symptoms (LUTS) 11/21/2015  . Advance care planning 11/17/2014  . 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  Routine general medical examination at a health care facility 09/06/2013  . Rash 11/09/2011  . Vertigo 11/09/2011  . ALLERGIC RHINITIS, SEASONAL 08/09/2009  . COLONIC POLYPS 04/17/2009  . Essential hypertension 11/16/2002  . LEUKOPENIA, CHRONIC 01/01/2001      Prior to Admission medications   Medication Sig Start Date End Date Taking? Authorizing Provider  fluticasone (FLONASE) 50 MCG/ACT nasal spray Place 2 sprays into both nostrils daily. 02/08/19   Tonia Ghent, MD  lisinopril (ZESTRIL) 20 MG tablet Take 1 tablet (20 mg total) by mouth daily. 07/29/19   Tonia Ghent, MD    Allergies Patient has no known allergies.    Social History Social History   Tobacco Use  . Smoking status: Never Smoker  . Smokeless tobacco: Never Used  Substance Use Topics  . Alcohol use: Yes    Comment: 6 pack per week  . Drug use: No    Review of Systems Patient denies headaches, rhinorrhea, blurry vision, numbness, shortness of breath, chest pain, edema, cough, abdominal pain, nausea, vomiting, diarrhea, dysuria, fevers, rashes or hallucinations unless otherwise stated above in HPI. ____________________________________________   PHYSICAL EXAM:  VITAL SIGNS: Vitals:   10/22/19 0902 10/22/19 1000  BP: 120/86 115/85  Pulse: (!) 101 (!) 106  Resp: 16 (!) 22  Temp:    SpO2: 98% 98%    Constitutional: Alert and oriented.  Eyes: Conjunctivae are normal.  Head: Atraumatic. Nose: No congestion/rhinnorhea. Mouth/Throat: Mucous membranes are moist.   Neck: No stridor. c-collar in place. No midline ttp step offs or deformities Cardiovascular: Normal rate, regular rhythm. Grossly normal heart sounds.  Good peripheral circulation. Respiratory: Normal respiratory effort.  No retractions. Lungs CTAB. Gastrointestinal: Soft and nontender. No distention. No abdominal bruits. No CVA tenderness. Genitourinary:  Musculoskeletal: No lower extremity tenderness nor edema.  No joint  effusions. Neurologic:  Normal speech and language. No gross focal neurologic deficits are appreciated. No facial droop Skin:  Skin is warm, dry and intact. No rash noted. Psychiatric: Mood and affect are normal. Speech and behavior are normal.  ____________________________________________   LABS (all labs ordered are listed, but only abnormal results are displayed)  Results for orders placed or performed during the hospital encounter of 10/22/19 (from the past 24 hour(s))  Basic metabolic panel     Status: Abnormal   Collection Time: 10/22/19  4:18 AM  Result Value Ref Range   Sodium 138 135 - 145 mmol/L   Potassium 3.5 3.5 - 5.1 mmol/L   Chloride 101 98 - 111 mmol/L   CO2 25 22 - 32 mmol/L   Glucose, Bld 121 (H) 70 - 99 mg/dL   BUN 22 (H) 6 - 20 mg/dL   Creatinine, Ser 1.15 0.61 - 1.24 mg/dL   Calcium 9.4 8.9 - 10.3 mg/dL   GFR calc non Af Amer >60 >60 mL/min   GFR calc Af Amer >60 >60 mL/min   Anion gap 12 5 - 15  CBC     Status: Abnormal   Collection Time: 10/22/19  4:18 AM  Result Value Ref Range   WBC 18.1 (H) 4.0 - 10.5 K/uL   RBC 4.96 4.22 - 5.81 MIL/uL   Hemoglobin 13.8 13.0 - 17.0 g/dL   HCT 40.4 39.0 - 52.0 %   MCV 81.5 80.0 - 100.0 fL   MCH 27.8 26.0 - 34.0 pg   MCHC 34.2 30.0 - 36.0 g/dL   RDW 13.6 11.5 - 15.5 %   Platelets 293 150 - 400 K/uL   nRBC 0.0 0.0 - 0.2 %  Hepatic function panel     Status: None   Collection Time: 10/22/19  4:18 AM  Result Value Ref Range   Total Protein 7.6 6.5 - 8.1 g/dL   Albumin 4.6 3.5 - 5.0 g/dL   AST 38 15 - 41 U/L   ALT 23 0 - 44 U/L   Alkaline Phosphatase 47 38 - 126 U/L   Total Bilirubin 0.7 0.3 - 1.2 mg/dL   Bilirubin, Direct <0.1 0.0 - 0.2 mg/dL   Indirect Bilirubin NOT CALCULATED 0.3 - 0.9 mg/dL  Lipase, blood     Status: None   Collection Time: 10/22/19  4:18 AM  Result Value Ref Range   Lipase 35 11 - 51 U/L  Troponin I (High Sensitivity)     Status: None   Collection Time: 10/22/19  4:18 AM  Result Value  Ref Range   Troponin I (High Sensitivity) 3 <18 ng/L  Troponin I (High Sensitivity)     Status: None   Collection Time: 10/22/19 10:36 AM  Result Value Ref Range   Troponin I (High Sensitivity) 4 <18 ng/L   ____________________________________________  EKG My review and personal interpretation at Time: 4:11   Indication: near syncope  Rate: 100  Rhythm: sinus Axis: normal Other: normal intervals, no stemi ____________________________________________  RADIOLOGY  I personally reviewed all radiographic images ordered to evaluate for the above acute complaints and reviewed radiology reports and findings.  These findings were personally discussed with the patient.  Please see medical record for radiology report.  ____________________________________________   PROCEDURES  Procedure(s) performed:  Procedures    Critical Care performed: no ____________________________________________   INITIAL IMPRESSION / ASSESSMENT AND PLAN / ED COURSE  Pertinent labs & imaging results that were available during my care of the patient were reviewed by me and considered in my medical decision making (see chart for details).   DDX: fracture, contusion, sdh, iph, concussion, dehydration, orthostasis, acs, dissection, gastritis,  Douglas Mathews is a 58 y.o. who presents to the ED with symptoms as described above.  Patient currently well-appearing in no acute distress.  CT imaging of the head and neck ordered out of triage shows no acute abnormalities.  C-spine cleared clinically after negative CT.  EKG shows no evidence of acute ischemia.  Blood work thus far is reassuring.  Given his pain will order CTA to exclude dissection.  His abdominal exam is soft and benign.  Clinical Course as of Oct 21 1117  Sat Oct 22, 2019  1116 Patient's work-up has been largely reassuring.  He is comfortable exam is reassuring appears clinically very well.  Does have mild leukocytosis but may be secondary to the  episodes of vomiting since he was having last night.  His abdominal exam remains soft and benign.  Possible gastritis or alcohol consumption associated symptoms.  He is currently not tachycardic with a heart rate in the low 90s normotensive.  At this point I believe he is stable and appropriate for outpatient follow-up.  Have discussed with the patient and available family all diagnostics and treatments performed thus far and all questions were answered to the best of my ability. The patient demonstrates understanding and agreement with plan. .   [PR]    Clinical Course User Index [PR] Merlyn Lot, MD    The patient was evaluated in Emergency Department today for the symptoms described in the history of present illness. He/she was evaluated in the context of the global COVID-19 pandemic, which necessitated consideration that the patient might be at risk for infection with the SARS-CoV-2 virus that causes COVID-19. Institutional protocols and algorithms that pertain to the evaluation of patients at risk for COVID-19 are in a state of rapid change based on information released by regulatory bodies including the CDC and federal and state organizations. These policies and algorithms were followed during the patient's care in the ED.  As part of my medical decision making, I reviewed the following data within the [place] notes reviewed and incorporated, Labs reviewed, notes from prior ED visits and [place] Controlled Substance Database   ____________________________________________   FINAL CLINICAL IMPRESSION(S) / ED DIAGNOSES  Final diagnoses:  Near syncope  Neck pain  Nausea      NEW MEDICATIONS STARTED DURING THIS VISIT:  New Prescriptions   No medications on file     Note:  This document was prepared using Dragon voice recognition software and may include unintentional dictation errors.    Merlyn Lot, MD 10/22/19 1118

## 2019-10-22 NOTE — ED Notes (Signed)
Patient to waiting room via wheelchair by EMS.  Per EMS complains of neck pain for 2 hours (6/10) and ? Syncopal episode.  Ambulatory on scene.  EMS vital signs:  HR 110, BP 140/79, pulse oxi 100% on room air.  Patient is on a clinical trial for EEP has been taking trial medicine for 2 weeks.

## 2019-10-22 NOTE — ED Triage Notes (Signed)
Patient to ED via EMS after experiencing neck pain and possible syncopal episode.  Patient actively vomiting in triage.

## 2019-10-24 ENCOUNTER — Other Ambulatory Visit: Payer: Self-pay | Admitting: Family Medicine

## 2019-10-25 ENCOUNTER — Encounter: Payer: Self-pay | Admitting: Family Medicine

## 2019-10-25 ENCOUNTER — Other Ambulatory Visit: Payer: Self-pay

## 2019-10-25 ENCOUNTER — Ambulatory Visit (INDEPENDENT_AMBULATORY_CARE_PROVIDER_SITE_OTHER): Payer: No Typology Code available for payment source | Admitting: Family Medicine

## 2019-10-25 VITALS — BP 110/76 | HR 71 | Temp 97.2°F | Ht 70.0 in | Wt 179.3 lb

## 2019-10-25 DIAGNOSIS — D72829 Elevated white blood cell count, unspecified: Secondary | ICD-10-CM

## 2019-10-25 DIAGNOSIS — R55 Syncope and collapse: Secondary | ICD-10-CM | POA: Diagnosis not present

## 2019-10-25 LAB — CBC WITH DIFFERENTIAL/PLATELET
Basophils Absolute: 0.1 10*3/uL (ref 0.0–0.1)
Basophils Relative: 1.1 % (ref 0.0–3.0)
Eosinophils Absolute: 0.1 10*3/uL (ref 0.0–0.7)
Eosinophils Relative: 1.3 % (ref 0.0–5.0)
HCT: 38.8 % — ABNORMAL LOW (ref 39.0–52.0)
Hemoglobin: 13 g/dL (ref 13.0–17.0)
Lymphocytes Relative: 16.6 % (ref 12.0–46.0)
Lymphs Abs: 1.3 10*3/uL (ref 0.7–4.0)
MCHC: 33.4 g/dL (ref 30.0–36.0)
MCV: 84.9 fl (ref 78.0–100.0)
Monocytes Absolute: 0.6 10*3/uL (ref 0.1–1.0)
Monocytes Relative: 7.6 % (ref 3.0–12.0)
Neutro Abs: 5.8 10*3/uL (ref 1.4–7.7)
Neutrophils Relative %: 73.4 % (ref 43.0–77.0)
Platelets: 244 10*3/uL (ref 150.0–400.0)
RBC: 4.57 Mil/uL (ref 4.22–5.81)
RDW: 14.7 % (ref 11.5–15.5)
WBC: 7.9 10*3/uL (ref 4.0–10.5)

## 2019-10-25 MED ORDER — LISINOPRIL 20 MG PO TABS: 10.0000 mg | ORAL_TABLET | Freq: Every day | ORAL | Status: DC

## 2019-10-25 NOTE — Patient Instructions (Addendum)
Cut the lisinopril back to 1/2 tab a day.  Drink enough fluid to keep your urine clear.  Update me about your BP next week.  Take care.  Glad to see you.  Update me as needed.

## 2019-10-25 NOTE — Progress Notes (Signed)
This visit occurred during the SARS-CoV-2 public health emergency.  Safety protocols were in place, including screening questions prior to the visit, additional usage of staff PPE, and extensive cleaning of exam room while observing appropriate contact time as indicated for disinfecting solutions.  ER f/u.  He assumed he had passed out.  He was at home, had got up to go to the BR.  He didn't feel well during urination.  Then went back to urinate again and felt like he was going to pass out after that.  He came to on the floor, sweaty.  No neck pain prior to ending up on the floor.  Then noted a "crick" in his neck with sig pain.  Pain with neck ROM.   Neck pain is better in the meantime but not back to normal.  He vomited at the ER but not since then.    Has some beer prior to bed.  He wasn't drinking water.  He has h/o getting lightheaded in the summertime.  Still on lisinopril.    No numbness or tingling in the meantime.  No h/o syncope.  Elevated WBC noted at ER.  Neck is still a little sore.  Taking ibuprofen for pain.   Meds, vitals, and allergies reviewed.   ROS: Per HPI unless specifically indicated in ROS section   Meds, vitals, and allergies reviewed.   ROS: Per HPI unless specifically indicated in ROS section   GEN: nad, alert and oriented HEENT: ncat except for abrasion L side of the face, this appears to be healing, does not appear to be infected.   NECK: supple w/o LA CV: rrr.  no murmur PULM: ctab, no inc wob ABD: soft, +bs EXT: no edema SKIN: no acute rash, well perfused CN 2-12 wnl B, S/S wnl x4

## 2019-10-26 DIAGNOSIS — R55 Syncope and collapse: Secondary | ICD-10-CM | POA: Insufficient documentation

## 2019-10-26 NOTE — Assessment & Plan Note (Signed)
Single event, with warning symptoms.  Likely due to vasovagal event/relative hypotension that was transient.  Likely multifactorial with relative dehydration from drinking beer, blood pressure medication, having recently urinated.  Unremarkable work-up except for elevated white count and recheck labs are pending.  See notes on labs No chest pain.  Not short of breath.  Still okay for outpatient follow-up.  Mechanism of likely relative transient hypotension discussed with patient.  Would decrease lisinopril to 10 mg a day.  He can check his blood pressure at home and update me as needed.  Discussed staying well-hydrated in general.  He agrees with plan. >25 minutes spent in face to face time with patient, >50% spent in counselling or coordination of care

## 2019-12-14 ENCOUNTER — Encounter: Payer: Self-pay | Admitting: Family Medicine

## 2019-12-14 ENCOUNTER — Other Ambulatory Visit: Payer: Self-pay

## 2019-12-14 MED ORDER — CLOBETASOL PROPIONATE 0.05 % EX CREA: TOPICAL_CREAM | CUTANEOUS | 12 refills | Status: DC

## 2019-12-14 NOTE — Progress Notes (Signed)
A user error has taken place: encounter opened in error, closed for administrative reasons.

## 2019-12-14 NOTE — Telephone Encounter (Signed)
Pharmacy requests refill on:   LAST REFILL: 2019 LAST OV:1/7/200 NEXT OV: none

## 2019-12-14 NOTE — Telephone Encounter (Signed)
Sent. Thanks.  Note sent to patient.   

## 2020-03-13 ENCOUNTER — Other Ambulatory Visit: Payer: Self-pay | Admitting: Family Medicine

## 2020-06-19 ENCOUNTER — Encounter: Payer: Self-pay | Admitting: Family Medicine

## 2020-08-24 ENCOUNTER — Encounter: Payer: Self-pay | Admitting: Family Medicine

## 2020-08-28 ENCOUNTER — Other Ambulatory Visit: Payer: Self-pay | Admitting: Family Medicine

## 2020-08-28 MED ORDER — TAMSULOSIN HCL 0.4 MG PO CAPS: 0.4000 mg | ORAL_CAPSULE | Freq: Every day | ORAL | 3 refills | Status: DC

## 2020-10-03 ENCOUNTER — Other Ambulatory Visit: Payer: Self-pay | Admitting: Family Medicine

## 2020-10-03 ENCOUNTER — Ambulatory Visit (INDEPENDENT_AMBULATORY_CARE_PROVIDER_SITE_OTHER): Payer: No Typology Code available for payment source

## 2020-10-03 DIAGNOSIS — Z23 Encounter for immunization: Secondary | ICD-10-CM

## 2020-10-03 DIAGNOSIS — I10 Essential (primary) hypertension: Secondary | ICD-10-CM

## 2020-10-03 DIAGNOSIS — Z125 Encounter for screening for malignant neoplasm of prostate: Secondary | ICD-10-CM

## 2020-10-18 ENCOUNTER — Other Ambulatory Visit: Payer: No Typology Code available for payment source

## 2020-10-25 ENCOUNTER — Encounter: Payer: No Typology Code available for payment source | Admitting: Family Medicine

## 2020-10-25 ENCOUNTER — Other Ambulatory Visit: Payer: No Typology Code available for payment source

## 2020-11-06 ENCOUNTER — Other Ambulatory Visit: Payer: Self-pay

## 2020-11-06 ENCOUNTER — Other Ambulatory Visit (INDEPENDENT_AMBULATORY_CARE_PROVIDER_SITE_OTHER): Payer: No Typology Code available for payment source

## 2020-11-06 DIAGNOSIS — Z125 Encounter for screening for malignant neoplasm of prostate: Secondary | ICD-10-CM

## 2020-11-06 DIAGNOSIS — I10 Essential (primary) hypertension: Secondary | ICD-10-CM | POA: Diagnosis not present

## 2020-11-06 LAB — LIPID PANEL
Cholesterol: 167 mg/dL (ref 0–200)
HDL: 51.5 mg/dL (ref >39.00)
LDL Cholesterol: 102 mg/dL — ABNORMAL HIGH (ref 0–99)
NonHDL: 115.54
Total CHOL/HDL Ratio: 3
Triglycerides: 69 mg/dL (ref 0.0–149.0)
VLDL: 13.8 mg/dL (ref 0.0–40.0)

## 2020-11-06 LAB — CBC WITH DIFFERENTIAL/PLATELET
Basophils Absolute: 0.1 10*3/uL (ref 0.0–0.1)
Basophils Relative: 0.9 % (ref 0.0–3.0)
Eosinophils Absolute: 0.2 10*3/uL (ref 0.0–0.7)
Eosinophils Relative: 2.2 % (ref 0.0–5.0)
HCT: 39.7 % (ref 39.0–52.0)
Hemoglobin: 13.7 g/dL (ref 13.0–17.0)
Lymphocytes Relative: 23.5 % (ref 12.0–46.0)
Lymphs Abs: 1.7 10*3/uL (ref 0.7–4.0)
MCHC: 34.4 g/dL (ref 30.0–36.0)
MCV: 83.3 fl (ref 78.0–100.0)
Monocytes Absolute: 0.6 10*3/uL (ref 0.1–1.0)
Monocytes Relative: 9 % (ref 3.0–12.0)
Neutro Abs: 4.5 10*3/uL (ref 1.4–7.7)
Neutrophils Relative %: 64.4 % (ref 43.0–77.0)
Platelets: 224 10*3/uL (ref 150.0–400.0)
RBC: 4.77 Mil/uL (ref 4.22–5.81)
RDW: 14.9 % (ref 11.5–15.5)
WBC: 7 10*3/uL (ref 4.0–10.5)

## 2020-11-06 LAB — COMPREHENSIVE METABOLIC PANEL
ALT: 19 U/L (ref 0–53)
AST: 23 U/L (ref 0–37)
Albumin: 4.7 g/dL (ref 3.5–5.2)
Alkaline Phosphatase: 43 U/L (ref 39–117)
BUN: 20 mg/dL (ref 6–23)
CO2: 30 mEq/L (ref 19–32)
Calcium: 9.3 mg/dL (ref 8.4–10.5)
Chloride: 102 mEq/L (ref 96–112)
Creatinine, Ser: 1 mg/dL (ref 0.40–1.50)
GFR: 83.74 mL/min (ref >60.00)
Glucose, Bld: 91 mg/dL (ref 70–99)
Potassium: 4.3 mEq/L (ref 3.5–5.1)
Sodium: 138 mEq/L (ref 135–145)
Total Bilirubin: 0.5 mg/dL (ref 0.2–1.2)
Total Protein: 6.9 g/dL (ref 6.0–8.3)

## 2020-11-06 LAB — PSA: PSA: 0.25 ng/mL (ref 0.10–4.00)

## 2020-11-09 ENCOUNTER — Encounter: Payer: No Typology Code available for payment source | Admitting: Family Medicine

## 2020-11-13 ENCOUNTER — Other Ambulatory Visit: Payer: No Typology Code available for payment source

## 2020-11-22 ENCOUNTER — Ambulatory Visit (INDEPENDENT_AMBULATORY_CARE_PROVIDER_SITE_OTHER): Payer: No Typology Code available for payment source | Admitting: Family Medicine

## 2020-11-22 ENCOUNTER — Encounter: Payer: Self-pay | Admitting: Family Medicine

## 2020-11-22 ENCOUNTER — Other Ambulatory Visit: Payer: Self-pay

## 2020-11-22 VITALS — BP 118/72 | HR 79 | Temp 97.8°F | Ht 70.0 in | Wt 176.0 lb

## 2020-11-22 DIAGNOSIS — R399 Unspecified symptoms and signs involving the genitourinary system: Secondary | ICD-10-CM

## 2020-11-22 DIAGNOSIS — Z7189 Other specified counseling: Secondary | ICD-10-CM

## 2020-11-22 DIAGNOSIS — Z0000 Encounter for general adult medical examination without abnormal findings: Secondary | ICD-10-CM

## 2020-11-22 DIAGNOSIS — I671 Cerebral aneurysm, nonruptured: Secondary | ICD-10-CM

## 2020-11-22 DIAGNOSIS — M545 Low back pain, unspecified: Secondary | ICD-10-CM

## 2020-11-22 DIAGNOSIS — I10 Essential (primary) hypertension: Secondary | ICD-10-CM

## 2020-11-22 MED ORDER — LISINOPRIL 10 MG PO TABS: 10.0000 mg | ORAL_TABLET | Freq: Every day | ORAL | 3 refills | Status: DC

## 2020-11-22 MED ORDER — CLOBETASOL PROPIONATE 0.05 % EX CREA: TOPICAL_CREAM | CUTANEOUS | 12 refills | Status: DC

## 2020-11-22 NOTE — Progress Notes (Signed)
This visit occurred during the SARS-CoV-2 public health emergency.  Safety protocols were in place, including screening questions prior to the visit, additional usage of staff PPE, and extensive cleaning of exam room while observing appropriate contact time as indicated for disinfecting solutions.  CPE- See plan.  Routine anticipatory guidance given to patient.  See health maintenance.  The possibility exists that previously documented standard health maintenance information may have been brought forward from a previous encounter into this note.  If needed, that same information has been updated to reflect the current situation based on today's encounter.    Tetanus 2020 Flu 2021 PNA and shingles d/w pt covid vaccine 2021 Colonoscopy 01/19/2018 PSA wnl. Prostate cancer screening and PSA options(with potential risks and benefits of testing vs not testing) were discussed along with recent recs/guidelines. He elects to keep testing PSAyearly.  Living will d/w pt. Wife designated if patient were incapacitated.  Diet and exercise. D/w pt. HCV neg 05/24/14 at [facility].  HIV screening prev done.   L lower back pain, when cutting and clearing wood recently.  No R sided sx.  Pain with some ROM now.  No trauma.  He had similar in the past.  Used tylenol recently.  Overall mild sx.    H/o aneurysm clipping the past with routine f/u with [facility].    Hypertension:    Using medication without problems or lightheadedness: yes Chest pain with exertion:no Edema:no Short of breath:no Labs d/w pt.  D/w pt about ASCVD score and diet and exercise.    He isn't on flomax, PSA wnl.  He has nocturia but isn't bothersome.  Would stay off flomax, discussed with patient.  PMH and SH reviewed  Meds, vitals, and allergies reviewed.   ROS: Per HPI.  Unless specifically indicated otherwise in HPI, the patient denies:  General: fever. Eyes: acute vision changes ENT: sore throat Cardiovascular: chest  pain Respiratory: SOB GI: vomiting GU: dysuria Musculoskeletal: acute back pain Derm: acute rash Neuro: acute motor dysfunction Psych: worsening mood Endocrine: polydipsia Heme: bleeding Allergy: hayfever  GEN: nad, alert and oriented HEENT: ncat NECK: supple w/o LA CV: rrr. PULM: ctab, no inc wob ABD: soft, +bs EXT: no edema SKIN: no acute rash  The 10-year ASCVD risk score Mikey Bussing DC Jr., et al., 2013) is: 5.5%   Values used to calculate the score:     Age: 60 years     Sex: Male     Is Non-Hispanic African American: No     Diabetic: No     Tobacco smoker: No     Systolic Blood Pressure: 109 mmHg     Is BP treated: Yes     HDL Cholesterol: 51.5 mg/dL     Total Cholesterol: 167 mg/dL

## 2020-11-22 NOTE — Patient Instructions (Addendum)
I would get a covid booster first.  Check with your insurance to see if they will cover the shingles shot. If you get lightheaded, the cut back on lisinopril and update me.  Take care.  Glad to see you. Update me as needed.

## 2020-11-25 DIAGNOSIS — M545 Low back pain, unspecified: Secondary | ICD-10-CM | POA: Insufficient documentation

## 2020-11-25 NOTE — Assessment & Plan Note (Signed)
Tetanus 2020 Flu 2021 PNA and shingles d/w pt covid vaccine 2021 Colonoscopy 01/19/2018 PSA wnl. Prostate cancer screening and PSA options(with potential risks and benefits of testing vs not testing) were discussed along with recent recs/guidelines. He elects to keep testing PSAyearly.  Living will d/w pt. Wife designated if patient were incapacitated.  Diet and exercise. D/w pt. HCV neg 05/24/14 at [facility].  HIV screening prev done.

## 2020-11-25 NOTE — Assessment & Plan Note (Signed)
Continue lisinopril.  Defer statin treatment given ASCVD score.  Continue work on diet and exercise.  He agrees.

## 2020-11-25 NOTE — Assessment & Plan Note (Signed)
Per [facility] clinic.  He has routine follow-up there.  No new symptoms.  I will defer.  He agrees.

## 2020-11-25 NOTE — Assessment & Plan Note (Signed)
Living will d/w pt.  Wife designated if patient were incapacitated.   ?

## 2020-11-25 NOTE — Assessment & Plan Note (Signed)
Nocturia is nonbothersome, off Flomax.  PSA still normal.  Discussed staying off Flomax since he did not have tremendous benefit from the medication.  He agrees.

## 2020-11-25 NOTE — Assessment & Plan Note (Signed)
Mild, improved.  Discussed using Tylenol as needed.  Noted after significant yard work.  He will update me as needed.

## 2020-11-30 IMAGING — CT CT CERVICAL SPINE W/O CM
3 of 4 series · 10 of 33 positions shown, 12 images · non-contrast
Comparison: None.

CLINICAL DATA: Neck pain and possible syncopal episode. Fall.
Vomiting.

EXAM:
CT CERVICAL SPINE WITHOUT CONTRAST
TECHNIQUE: Multidetector CT imaging of the cervical spine was performed without
intravenous contrast. Multiplanar CT image reconstructions were also
generated.

[Series 4: sagittal bone · sagittal · 0.26mm/px · 5 of 46 slices shown, 6 images]
[im 16/46  bone]
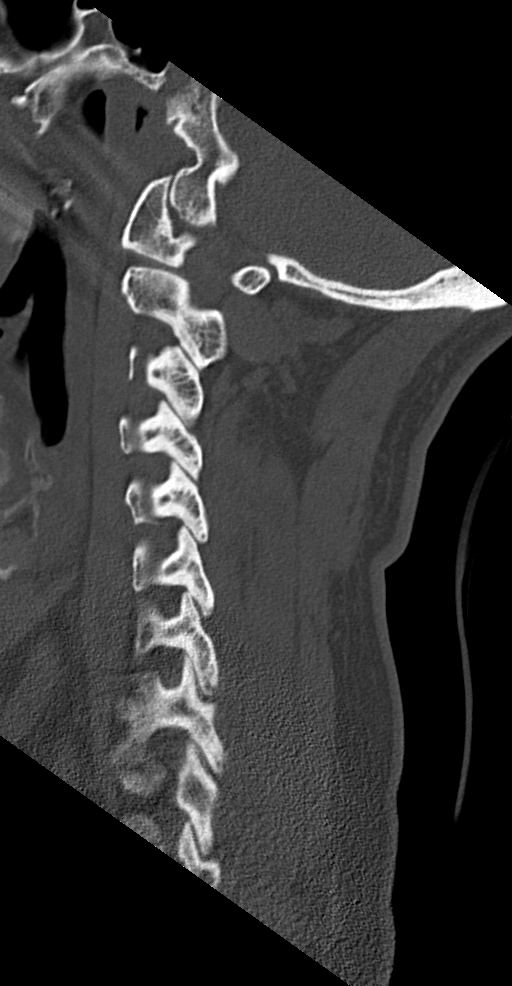
[im 19/46  bone]
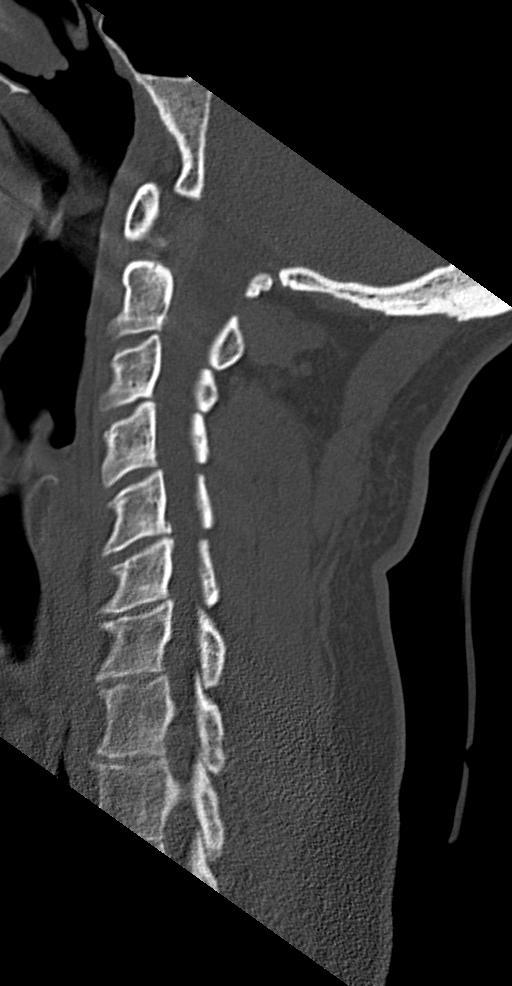
[im 23/46  soft-tissue]
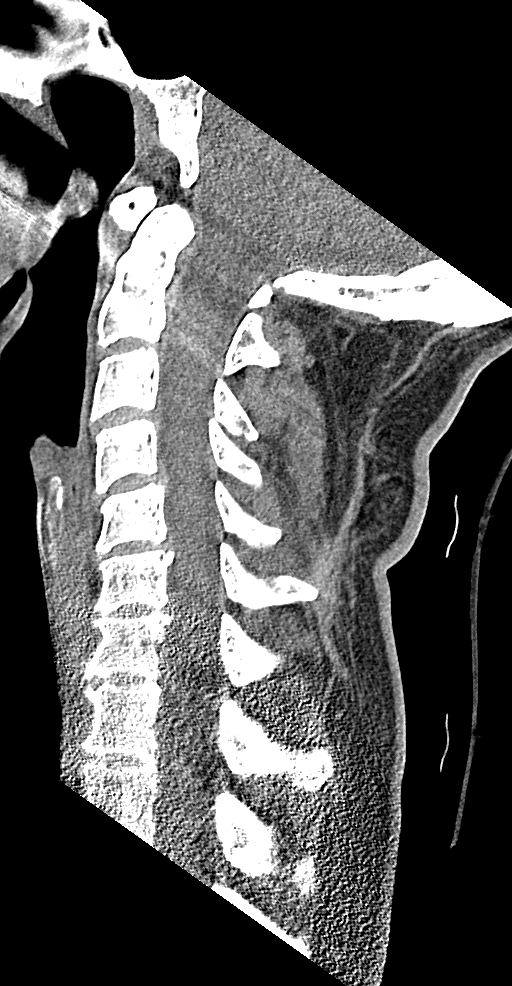
[im 23/46  bone]
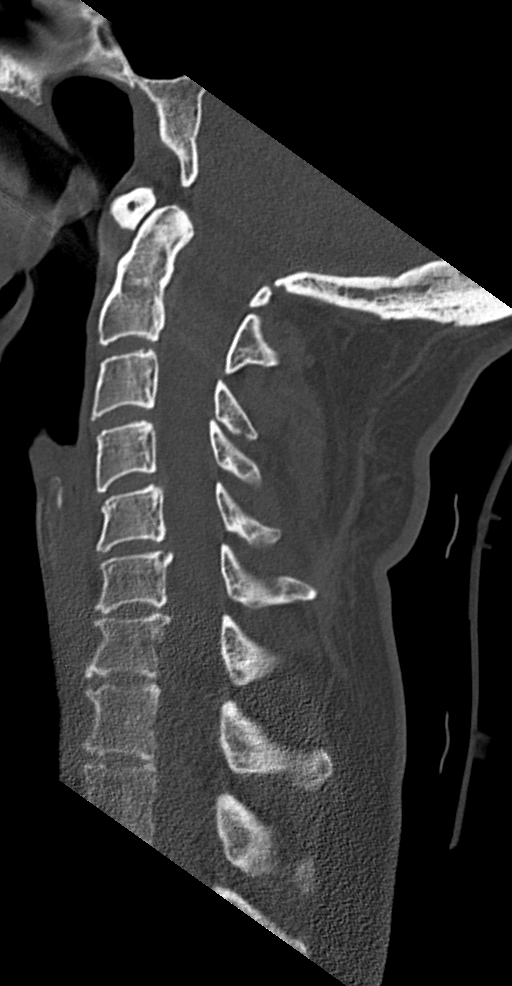
[im 27/46  bone]
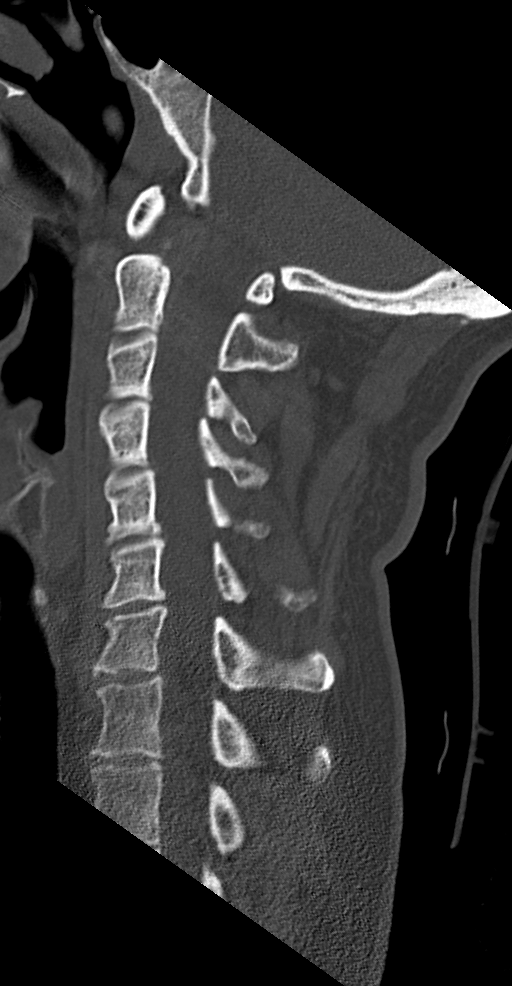
[im 31/46  bone]
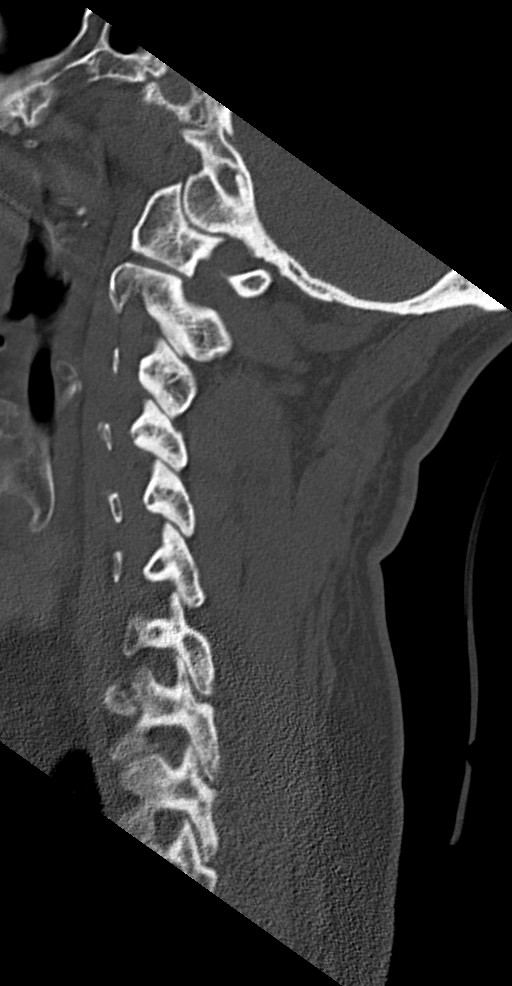

[Series 5: coronal bone · coronal · 0.22mm/px · 3 of 41 slices shown]
[im 9/41  bone]
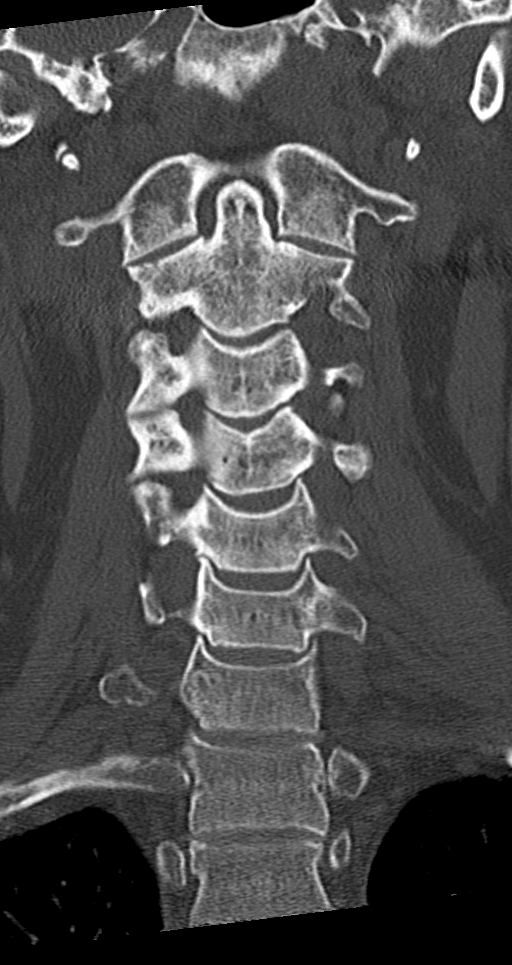
[im 17/41  bone]
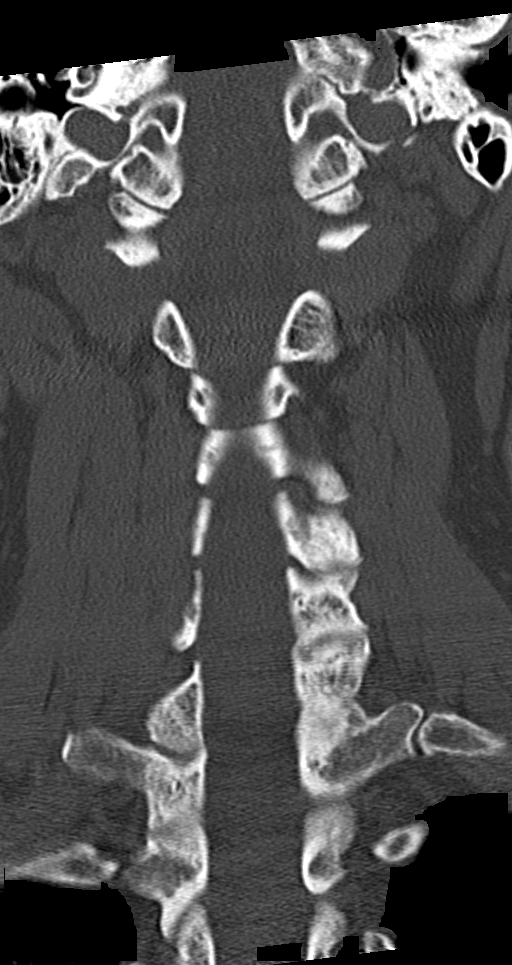
[im 25/41  bone]
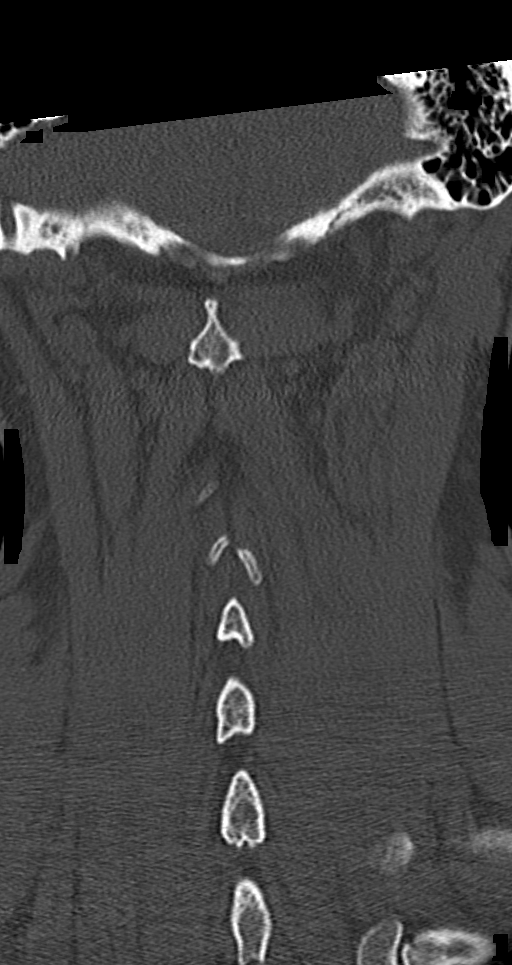

[Series 6: orthogonal bone · axial · 0.21mm/px · z∈[-205,-155]mm · 2 of 92 slices shown, 3 images]
[im 31/92  soft-tissue]
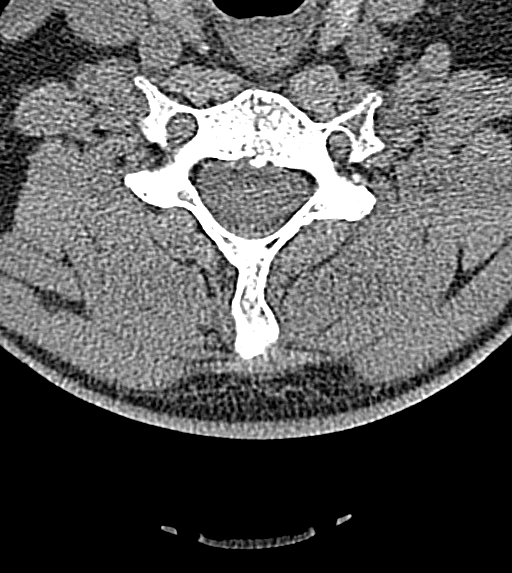
[im 31/92  bone]
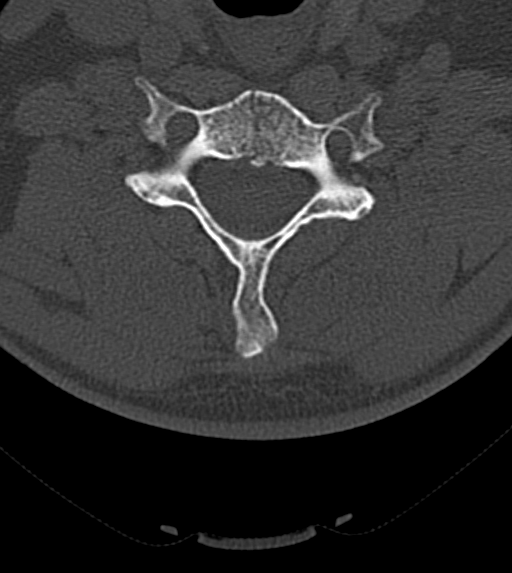
[im 61/92  bone]
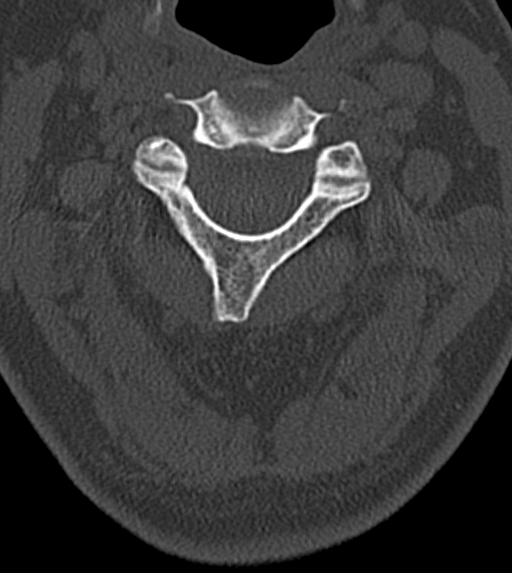

[10 of 33 positions shown; findings below may reference images not displayed]

FINDINGS: Alignment: Straightening of normal lordosis. No traumatic
subluxation.

Skull base and vertebrae: No acute fracture. Vertebral body heights
are maintained. The dens and skull base are intact.

Soft tissues and spinal canal: No prevertebral fluid or swelling. No
visible canal hematoma.

Disc levels: Mild disc space narrowing and endplate spurring at
C5-C6 and C6-C7.

Upper chest: Negative.

Other: None.
IMPRESSION: Mild degenerative change in the cervical spine. No acute fracture or
subluxation.

## 2021-07-11 ENCOUNTER — Telehealth: Payer: Self-pay | Admitting: Family Medicine

## 2021-07-11 NOTE — Telephone Encounter (Signed)
I spoke with pt's wife (DPR signed); pt tested + covid for home test ??. Pts wife said the home test C line looked very bright but the T line was very faint. Pts wife is not sure about test results due to T line being so faint. Symptoms started on 07/09/21 with chills and H/A pain level 7.  On 07/10/21 pt had achiness, temp was 101 with headache and vomiting. Pts wife said pt felt like had the flu. Pts wife said pt is trying to drink Coke,gingerale. Pt is trying to drink today; vomited all night. This morning dry heaving a lot. Pt has dry mouth and abd pain. Not sure if dizzy or not. Pts wife is not with pt;not sure of what temp was this morning. Pts wife not sure of name of med pt takes for vomiting. This morning when pt s wife left for work pt did say he felt really bad and pts wife is not sure when last time urinated since not home this morning but she said pt said before she left for work that he had not urinated and felt really dry. Pts wife said pt is not going to want to go to ED. I spoke with [name] and gave above info and he said pt needed to go to ED for eval and likely IV fluids. Pts wife tried to call pt while I was on phone but no answer and she will have her sister in law to ck on pt and pts wife will either cb to [facility] or my chart [name] to let him know if pt will agree to go to ED. Sending note to [name] and Janett Billow CMA.

## 2021-07-11 NOTE — Telephone Encounter (Signed)
[facility] - Client TELEPHONE ADVICE RECORD Access[facility]urse Patient Name: Douglas Mathews Gender: Male DOB: 08/01/1961 Age: 60 Y 56 M 7 D Return Phone Number: [phone] (Primary), [phone] (Secondary) Address: City/ State/ Zip: [place] [address] Client [facility] - Client Client Site [place] - Day Physician Renford Dills - MD Contact Type Call Who Is Calling Patient / Member / Family / Caregiver Call Type Triage / Clinical Relationship To Patient Self Return Phone Number [phone] (Primary) Chief Complaint Headache Reason for Call Symptomatic / Request for Health Information Initial Comment The caller states that he may have tested positive for COVID-19 (unable to definitively tell due to faint line) and is currently experiencing chills, fever, upset stomach, and headache. Translation No Nurse Assessment Nurse: Doyle Askew, RN, Beth Date/Time (Eastern Time): 07/11/2021 2:24:12 PM Confirm and document reason for call. If symptomatic, describe symptoms. ---Caller states that he may have tested positive for COVID-19 (unable to definitively tell due to faint line) and is currently experiencing chills, fever, upset stomach, and headache started last night. Caller states current temp is 100.3 orally. Does the patient have any new or worsening symptoms? ---Yes Will a triage be completed? ---Yes Related visit to physician within the last 2 weeks? ---No Does the PT have any chronic conditions? (i.e. diabetes, asthma, this includes High risk factors for pregnancy, etc.) ---Yes List chronic conditions. ---HTN Is this a behavioral health or substance abuse call? ---No Guidelines Guideline Title Affirmed Question Affirmed Notes Nurse Date/Time (Eastern Time) COVID-19 - Diagnosed or Suspected [1] COVID-19 diagnosed by positive lab test (e.g., PCR, rapid self-test kit) AND [2] mild  symptoms (e.g., cough, fever, Doyle Askew, RN, Beth 07/11/2021 2:25:56 PM PLEASE NOTE: All timestamps contained within this report are represented as [place] Standard Time. CONFIDENTIALTY NOTICE: This fax transmission is intended only for the addressee. It contains information that is legally privileged, confidential or otherwise protected from use or disclosure. If you are not the intended recipient, you are strictly prohibited from reviewing, disclosing, copying using or disseminating any of this information or taking any action in reliance on or regarding this information. If you have received this fax in error, please notify us immediately by telephone so that we can arrange for its return to [place]. Phone: [phone], Toll-Free: [phone], Fax: [phone] Page: 2 of 4 Call Id: [id number] Guidelines Guideline Title Affirmed Question Affirmed Notes Nurse Date/Time Eilene Ghazi Time) others) AND [1] no complications or SOB Disp. Time Eilene Ghazi Time) Disposition Final User 07/11/2021 2:36:25 PM Home Care Yes Doyle Askew, RN, Beth Caller Disagree/Comply Comply Caller Understands Yes PreDisposition Call another nurse Care Advice Given Per Guideline HOME CARE: * You should be able to treat this at home. GENERAL CARE ADVICE FOR COVID-19 SYMPTOMS: * The symptoms are generally treated the same whether you have COVID-19, influenza or some other respiratory virus. * Feeling dehydrated: Drink extra liquids. If the air in your home is dry, use a humidifier. * Fever: For fever over 101 F (38.3 C), take acetaminophen every 4 to 6 hours (Adults 650 mg) OR ibuprofen every 6 to 8 hours (Adults 400 mg). Before taking any medicine, read all the instructions on the package. Do not take aspirin unless your doctor has prescribed it for you. * Muscle aches, headache, and other pains: Often this comes and goes with the fever. Take acetaminophen every 4 to 6 hours (Adults 650 mg) OR ibuprofen every 6 to 8 hours (Adults 400  mg). Before  taking any medicine, read all the instructions on the package. PAIN AND FEVER MEDICINES: * For pain or fever relief, take either acetaminophen or ibuprofen. * They are over-the-counter (OTC) drugs that help treat both fever and pain. You can buy them at the drugstore. * Treat fevers above 101 F (38.3 C). The goal of fever therapy is to bring the fever down to a comfortable level. Remember that fever medicine usually lowers fever 2 degrees F (1 - 1 1/2 degrees C). * ACETAMINOPHEN - REGULAR STRENGTH TYLENOL: Take 650 mg (two 325 mg pills) by mouth every 4 to 6 hours as needed. Each Regular Strength Tylenol pill has 325 mg of acetaminophen. The most you should take each day is 3,250 mg (10 pills a day). * ACETAMINOPHEN - EXTRA STRENGTH TYLENOL: Take 1,000 mg (two 500 mg pills) every 8 hours as needed. Each Extra Strength Tylenol pill has 500 mg of acetaminophen. The most you should take each day is 3,000 mg (6 pills a day). * IBUPROFEN (E.G., MOTRIN, ADVIL): Take 400 mg (two 200 mg pills) by mouth every 6 hours. The most you should take each day is 1,200 mg (six 200 mg pills), unless your doctor has told you to take more. COVID-19 - HOW TO PROTECT OTHERS - WHEN YOU ARE SICK WITH COVID-19: * STAY HOME A MINIMUM OF 5 DAYS: Home isolation is needed for at least 5 days after the symptoms started. Stay home from school or work if you are sick. Do NOT go to religious services, child care centers, shopping, or other public places. Do NOT use public transportation (e.g., bus, taxis, ride-sharing). Do NOT allow any visitors to your home. Leave the house only if you need to seek urgent medical care. * You become worse * Chest pain or difficulty breathing occurs * Fever returns after being gone for 24 hours * Fever lasts over 3 days * Fever over 103 F (39.4 C) CALL BACK IF: CARE ADVICE given per COVID-19 - DIAGNOSED OR SUSPECTED (Adult) guideline. Standing  Orders Preparation Additional Instructions Route FrequencyDuration Nurse Comments User Name Phenergan 25 mg 1 Suppository # 6, ONLY if not able to make appt within recommended time frame Per Rectum Every 4 Hours Doyle Askew, RN, Beth PLEASE NOTE: All timestamps contained within this report are represented as [place] Standard Time. CONFIDENTIALTY NOTICE: This fax transmission is intended only for the addressee. It contains information that is legally privileged, confidential or otherwise protected from use or disclosure. If you are not the intended recipient, you are strictly prohibited from reviewing, disclosing, copying using or disseminating any of this information or taking any action in reliance on or regarding this information. If you have received this fax in error, please notify us immediately by telephone so that we can arrange for its return to [place]. Phone: [phone], Toll-Free: [phone], Fax: [phone] Page: 3 of 4 Call Id:

## 2021-07-11 NOTE — Telephone Encounter (Signed)
Patient call in tested for Covid , symptoms of Chills, Fever, Headache patient was transfer to Triage nurse

## 2021-07-11 NOTE — Telephone Encounter (Signed)
I was unable to speak with pt but I called and pts wife is on her way home; [name] is not sure what pt talked about when he called back and spoke with access nurse; [name] is not sure if pt is going to ED or not as advised earlier. [name] is not sure if pt is feeling better or not. [name] said when she gets home will talk with pt and will my chart [name] with update. Sending note to [name] and will give [name] this message in person. See also other telephone note dated 07/11/21. I spoke with pts wife and advised that pt should go to ED for eval and testing and possible IV fluids per [name]. I spoke with pts wife again just to verify she understood this advice and instructions from [name] and [name] said she does understand and when she gets home she will talk with her husband and let him know this info. [name] will my chart with pts decision of going to ED. Sending note to [name].

## 2021-07-11 NOTE — Telephone Encounter (Signed)
[facility] - Client TELEPHONE ADVICE RECORD Access[facility] Patient Name: Douglas Mathews Gender: Male DOB: 30-Jan-1961 Age: 60 Y 15 M 7 D Return Phone Number: [phone] (Primary), [phone] (Secondary) Address: City/ State/ Zip: [place] [address] Client [facility] - Client Client Site [place] - Day Physician Renford Dills - MD Contact Type Call Who Is Calling Patient / Member / Family / Caregiver Call Type Triage / Clinical Relationship To Patient Self Return Phone Number [phone] (Secondary) Chief Complaint Vomiting Reason for Call Symptomatic / Request for Health Information Initial Comment Caller states tested pos about 30 mins ago for COVID19; Tuesday chills, achiness yesterday; 101 temp last night; headache; vomiting; last urinated 90 mins ago; no appts avail per office; Translation No Disp. Time Eilene Ghazi Time) Disposition Final User 07/11/2021 11:33:05 AM Attempt made - message left Doyle Askew, RN, Beth 07/11/2021 11:40:11 AM FINAL ATTEMPT MADE - message left Yes Doyle Askew, RN, [employer]

## 2021-07-11 NOTE — Telephone Encounter (Signed)
Mr. Douglas Mathews called in wanted to know if he can get something for covid today from home test and he has fever and chills and nausea and wanted to know about getting suppitories

## 2021-07-11 NOTE — Telephone Encounter (Signed)
Noted. Thanks.

## 2021-07-12 NOTE — Telephone Encounter (Signed)
Noted. Thanks.

## 2021-07-12 NOTE — Telephone Encounter (Signed)
If no longer vomiting and able to maintain adequate hydration at home, and if he can manage his symptoms supportively, then I think it makes sense to continue with routine home care.  If he is getting worse then I think he needs to seek evaluation.  That is the best advice I have at this point.  I hope he feels better in the near future.  Thanks.

## 2021-07-12 NOTE — Telephone Encounter (Signed)
I spoke with pt; Pt said he is doing OK; pt cannot take tylenol due to after couple of doses of Tylenol does causes pt to be sick on stomach with vomiting. Pt has to have suppository. Pt said he has not used the phenergan suppository. Pt did not go to ED. Pt said he has been drinking a lot of water; pt said mouth little dry; no stomach pain and no nausea now. Fever one hr ago 100.7, chills and body aches, prod cough with brownish yellow phlegm; H/A pain scale 7.  No CP or SOB.  No diarrhea and last vomited on 07/11/21 at 5:30 AM. No loss of taste or smell; no runny nose or head congestion. Pt said scratchy S/T. Pt said the covid test done repeated last night pts wife thought she saw a faint line and pt did not see line.  Pts symptoms started on 07/10/21. UC & ED precautions given and pt voiced understanding.[facility]. Pt does not want to go to UC or ED at this time. Pt thinks he can "ride this thing out". Pt request cb instead of my chart note after reviewed by [name].

## 2021-07-12 NOTE — Telephone Encounter (Signed)
[facility] Night - Client Nonclinical Telephone Record [facility]urse Client [facility] Night - Client Client Site [place] - Night Contact Type Call Who Is Calling Patient / Member / Family / Caregiver Caller Name Joneric Devereux Phone Number [phone] Call Type Message Only Information Provided Reason for Call Returning a Call from the Office Initial [place] is returning a call from the office Additional Comment Office hours provided Disp. Time Disposition Final User 07/11/2021 5:07:46 PM General Information Provided Yes Domenica Reamer Call Closed By: Domenica Reamer Transaction Date/Time: 07/11/2021 5:05:57 PM (ET)

## 2021-07-12 NOTE — Telephone Encounter (Signed)
Pt notified as instructed and pt voiced understanding. No further questions or needs at this time.

## 2021-11-07 ENCOUNTER — Encounter: Payer: Self-pay | Admitting: Family Medicine

## 2021-11-07 ENCOUNTER — Ambulatory Visit (INDEPENDENT_AMBULATORY_CARE_PROVIDER_SITE_OTHER): Payer: No Typology Code available for payment source | Admitting: Family Medicine

## 2021-11-07 ENCOUNTER — Other Ambulatory Visit: Payer: Self-pay

## 2021-11-07 DIAGNOSIS — M7710 Lateral epicondylitis, unspecified elbow: Secondary | ICD-10-CM | POA: Diagnosis not present

## 2021-11-07 NOTE — Patient Instructions (Addendum)
Limit activity that hurts.   Ice 5 minutes at a time.  Use a tennis elbow brace.   Ibuprofen with food if needed, 2-3 tabs 2-3 times a day if needed.  Try to limit use.   Gently stretch your arm.   Take care.  Glad to see you.

## 2021-11-07 NOTE — Progress Notes (Signed)
This visit occurred during the SARS-CoV-2 public health emergency.  Safety protocols were in place, including screening questions prior to the visit, additional usage of staff PPE, and extensive cleaning of exam room while observing appropriate contact time as indicated for disinfecting solutions.  L arm pain, at L lateral elbow.  Noted after raking leaves.  No medial pain.  Annoying level of discomfort.  No pain at rest.  Occ ibuprofen use.  No wrist or shoulder pain.    He wasn't using flomax, med list updated.  He is putting up with nocturia.  He'll update me as needed.    Meds, vitals, and allergies reviewed.   ROS: Per HPI unless specifically indicated in ROS section   Nad Ncat Normal left shoulder and wrist range of motion.  He has intact flexion and extension at the elbow but he has pain near the lateral epicondyle without bruising or redness.  His discomfort on testing is improved with compression of the proximal left forearm.  Distally neurovascular intact.

## 2021-11-10 DIAGNOSIS — M7710 Lateral epicondylitis, unspecified elbow: Secondary | ICD-10-CM | POA: Insufficient documentation

## 2021-11-10 NOTE — Assessment & Plan Note (Signed)
Discussed tennis elbow anatomy and treatment Limit activity that hurts.   Ice 5 minutes at a time.  Use a tennis elbow brace.   Ibuprofen with food if needed, 2-3 tabs 2-3 times a day if needed.  Try to limit use.   Gently stretch your arm.   Will update me as needed.

## 2021-12-18 ENCOUNTER — Encounter: Payer: Self-pay | Admitting: Family Medicine

## 2021-12-18 MED ORDER — LISINOPRIL 10 MG PO TABS: 10.0000 mg | ORAL_TABLET | Freq: Every day | ORAL | 1 refills | Status: DC

## 2021-12-18 MED ORDER — LISINOPRIL 10 MG PO TABS: 10.0000 mg | ORAL_TABLET | Freq: Every day | ORAL | 0 refills | Status: DC

## 2021-12-18 NOTE — Addendum Note (Signed)
Addended by: Sherrilee Gilles B on: 12/18/2021 11:32 AM   Modules accepted: Orders

## 2022-01-27 ENCOUNTER — Encounter: Payer: Self-pay | Admitting: Family Medicine

## 2022-01-27 MED ORDER — FLUTICASONE PROPIONATE 50 MCG/ACT NA SUSP: NASAL | 4 refills | Status: DC

## 2022-01-30 ENCOUNTER — Encounter: Payer: Self-pay | Admitting: Family Medicine

## 2022-02-09 ENCOUNTER — Other Ambulatory Visit: Payer: Self-pay | Admitting: Family Medicine

## 2022-02-09 DIAGNOSIS — I10 Essential (primary) hypertension: Secondary | ICD-10-CM

## 2022-02-09 DIAGNOSIS — Z125 Encounter for screening for malignant neoplasm of prostate: Secondary | ICD-10-CM

## 2022-02-09 NOTE — Addendum Note (Signed)
Addended by: Tonia Ghent on: 02/09/2022 05:56 PM ? ? Modules accepted: Orders ? ?

## 2022-02-21 ENCOUNTER — Other Ambulatory Visit (INDEPENDENT_AMBULATORY_CARE_PROVIDER_SITE_OTHER): Payer: No Typology Code available for payment source

## 2022-02-21 DIAGNOSIS — I10 Essential (primary) hypertension: Secondary | ICD-10-CM

## 2022-02-21 DIAGNOSIS — Z125 Encounter for screening for malignant neoplasm of prostate: Secondary | ICD-10-CM | POA: Diagnosis not present

## 2022-02-21 LAB — LIPID PANEL
Cholesterol: 146 mg/dL (ref 0–200)
HDL: 47.4 mg/dL (ref >39.00)
LDL Cholesterol: 86 mg/dL (ref 0–99)
NonHDL: 98.11
Total CHOL/HDL Ratio: 3
Triglycerides: 63 mg/dL (ref 0.0–149.0)
VLDL: 12.6 mg/dL (ref 0.0–40.0)

## 2022-02-21 LAB — CBC WITH DIFFERENTIAL/PLATELET
Basophils Absolute: 0.1 10*3/uL (ref 0.0–0.1)
Basophils Relative: 1.3 % (ref 0.0–3.0)
Eosinophils Absolute: 0.2 10*3/uL (ref 0.0–0.7)
Eosinophils Relative: 3.7 % (ref 0.0–5.0)
HCT: 38.4 % — ABNORMAL LOW (ref 39.0–52.0)
Hemoglobin: 12.9 g/dL — ABNORMAL LOW (ref 13.0–17.0)
Lymphocytes Relative: 18.8 % (ref 12.0–46.0)
Lymphs Abs: 1.2 10*3/uL (ref 0.7–4.0)
MCHC: 33.5 g/dL (ref 30.0–36.0)
MCV: 83.9 fl (ref 78.0–100.0)
Monocytes Absolute: 0.5 10*3/uL (ref 0.1–1.0)
Monocytes Relative: 8.6 % (ref 3.0–12.0)
Neutro Abs: 4.3 10*3/uL (ref 1.4–7.7)
Neutrophils Relative %: 67.6 % (ref 43.0–77.0)
Platelets: 238 10*3/uL (ref 150.0–400.0)
RBC: 4.58 Mil/uL (ref 4.22–5.81)
RDW: 14.8 % (ref 11.5–15.5)
WBC: 6.3 10*3/uL (ref 4.0–10.5)

## 2022-02-21 LAB — COMPREHENSIVE METABOLIC PANEL
ALT: 13 U/L (ref 0–53)
AST: 19 U/L (ref 0–37)
Albumin: 4.3 g/dL (ref 3.5–5.2)
Alkaline Phosphatase: 47 U/L (ref 39–117)
BUN: 18 mg/dL (ref 6–23)
CO2: 25 mEq/L (ref 19–32)
Calcium: 8.9 mg/dL (ref 8.4–10.5)
Chloride: 103 mEq/L (ref 96–112)
Creatinine, Ser: 1.02 mg/dL (ref 0.40–1.50)
GFR: 81.04 mL/min (ref >60.00)
Glucose, Bld: 84 mg/dL (ref 70–99)
Potassium: 4.4 mEq/L (ref 3.5–5.1)
Sodium: 137 mEq/L (ref 135–145)
Total Bilirubin: 0.4 mg/dL (ref 0.2–1.2)
Total Protein: 6.6 g/dL (ref 6.0–8.3)

## 2022-02-21 LAB — PSA: PSA: 0.14 ng/mL (ref 0.10–4.00)

## 2022-02-27 ENCOUNTER — Encounter: Payer: Self-pay | Admitting: Family Medicine

## 2022-02-28 ENCOUNTER — Ambulatory Visit (INDEPENDENT_AMBULATORY_CARE_PROVIDER_SITE_OTHER): Payer: No Typology Code available for payment source | Admitting: Family Medicine

## 2022-02-28 ENCOUNTER — Encounter: Payer: Self-pay | Admitting: Family Medicine

## 2022-02-28 VITALS — BP 110/72 | HR 74 | Temp 97.7°F | Ht 70.0 in | Wt 176.0 lb

## 2022-02-28 DIAGNOSIS — R519 Headache, unspecified: Secondary | ICD-10-CM

## 2022-02-28 DIAGNOSIS — Z0000 Encounter for general adult medical examination without abnormal findings: Secondary | ICD-10-CM | POA: Diagnosis not present

## 2022-02-28 DIAGNOSIS — Z7189 Other specified counseling: Secondary | ICD-10-CM

## 2022-02-28 DIAGNOSIS — I10 Essential (primary) hypertension: Secondary | ICD-10-CM

## 2022-02-28 DIAGNOSIS — I671 Cerebral aneurysm, nonruptured: Secondary | ICD-10-CM

## 2022-02-28 DIAGNOSIS — R110 Nausea: Secondary | ICD-10-CM

## 2022-02-28 MED ORDER — PROMETHEGAN 25 MG RE SUPP: RECTAL | 1 refills | Status: DC

## 2022-02-28 MED ORDER — LISINOPRIL 10 MG PO TABS: 10.0000 mg | ORAL_TABLET | Freq: Every day | ORAL | 3 refills | Status: DC

## 2022-02-28 MED ORDER — CLOBETASOL PROPIONATE 0.05 % EX CREA: TOPICAL_CREAM | CUTANEOUS | 12 refills | Status: DC

## 2022-02-28 NOTE — Patient Instructions (Signed)
Let me see about getting the MRI set up.  ?Use the bedside exercises for vertigo and we'll go from there.  ?Take care.  Glad to see you. ?Update me as needed.   ?

## 2022-02-28 NOTE — Progress Notes (Signed)
CPE- See plan.  Routine anticipatory guidance given to patient.  See health maintenance.  The possibility exists that previously documented standard health maintenance information may have been brought forward from a previous encounter into this note.  If needed, that same information has been updated to reflect the current situation based on today's encounter.   ? ?Tetanus 2020 ?Flu prev done.  ?PNA not due.  ?Shingles prev done per patient report.   ?covid vaccine 2021 ?Colonoscopy 01/19/2018 ?PSA wnl.  Prostate cancer screening and PSA options (with potential risks and benefits of testing vs not testing) were discussed along with recent recs/guidelines.  He elects to keep testing PSA yearly.   ?Living will d/w pt. Wife designated if patient were incapacitated.  ?Diet and exercise. D/w pt. ?HCV neg 05/24/14 at [facility].  ?HIV screening prev done.   ? ?Hypertension:    ?Using medication without problems or lightheadedness: see vertigo sx.   ?Chest pain with exertion:no ?Edema:no  ?Short of breath:no ?Labs d/w pt.   ? ?H/o cerebral aneurysm repair 2019.  He has noted occ headaches but no new focal neuro sx.  Vertigo d/w pt.  Room spins with laying down, position change.  He has sx with rolling over in bed or with laying down.  H/o vertigo noted, in the past.  Sx worse at initial onset, some better in the meantime.  Intermittent sx.   ? ?Rare use of suppository for nausea.  When he starts vomiting, he has trouble stopping the cycle of vomiting.  The phenergan suppository helps better than oral meds.  Father had similar issues.  ? ?PMH and SH reviewed ? ?Meds, vitals, and allergies reviewed.  ? ?ROS: Per HPI.  Unless specifically indicated otherwise in HPI, the patient denies: ? ?General: fever. ?Eyes: acute vision changes ?ENT: sore throat ?Cardiovascular: chest pain ?Respiratory: SOB ?GI: vomiting ?GU: dysuria ?Musculoskeletal: acute back pain ?Derm: acute rash ?Neuro: acute motor dysfunction ?Psych: worsening  mood ?Endocrine: polydipsia ?Heme: bleeding ?Allergy: hayfever ? ?GEN: nad, alert and oriented ?HEENT: ncat ?NECK: supple w/o LA ?CV: rrr. ?PULM: ctab, no inc wob ?ABD: soft, +bs ?EXT: no edema ?SKIN: no acute rash ?CN 2-12 wnl B, S/S wnl x4 ? ?

## 2022-03-02 DIAGNOSIS — R110 Nausea: Secondary | ICD-10-CM | POA: Insufficient documentation

## 2022-03-02 NOTE — Assessment & Plan Note (Signed)
History of clipping and it is reasonable to repeat imaging given his history of vertigo and occasional headaches.  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 ?

## 2022-03-02 NOTE — Assessment & Plan Note (Signed)
Tetanus 2020 ?Flu prev done.  ?PNA not due.  ?Shingles prev done per patient report.   ?covid vaccine 2021 ?Colonoscopy 01/19/2018 ?PSA wnl.  Prostate cancer screening and PSA options (with potential risks and benefits of testing vs not testing) were discussed along with recent recs/guidelines.  He elects to keep testing PSA yearly.   ?Living will d/w pt. Wife designated if patient were incapacitated.  ?Diet and exercise. D/w pt. ?HCV neg 05/24/14 at [facility].  ?HIV screening prev done.   ?

## 2022-03-02 NOTE — Assessment & Plan Note (Signed)
Living will d/w pt.  Wife designated if patient were incapacitated.   ?

## 2022-03-02 NOTE — Assessment & Plan Note (Signed)
Episodic symptoms with relief from promethazine suppository.  Used as needed. ?

## 2022-03-02 NOTE — Assessment & Plan Note (Signed)
Continue lisinopril.  Continue work on diet and exercise.  Labs discussed with patient. 

## 2022-03-02 NOTE — Telephone Encounter (Signed)
Discussed with patient at office visit.  See office visit note. ?

## 2022-03-13 ENCOUNTER — Encounter: Payer: Self-pay | Admitting: Family Medicine

## 2022-03-20 ENCOUNTER — Encounter: Payer: Self-pay | Admitting: Family Medicine

## 2022-04-16 ENCOUNTER — Encounter: Payer: Self-pay | Admitting: *Deleted

## 2022-05-12 ENCOUNTER — Encounter: Payer: Self-pay | Admitting: Family Medicine

## 2022-05-12 MED ORDER — LISINOPRIL 10 MG PO TABS: 10.0000 mg | ORAL_TABLET | Freq: Every day | ORAL | 0 refills | Status: DC

## 2022-06-16 ENCOUNTER — Encounter: Payer: Self-pay | Admitting: Family Medicine

## 2022-06-18 ENCOUNTER — Ambulatory Visit: Payer: No Typology Code available for payment source | Admitting: Nurse Practitioner

## 2023-02-11 ENCOUNTER — Encounter: Payer: Self-pay | Admitting: Family Medicine

## 2023-03-09 ENCOUNTER — Other Ambulatory Visit: Payer: Self-pay | Admitting: Family Medicine

## 2023-04-05 ENCOUNTER — Other Ambulatory Visit: Payer: Self-pay | Admitting: Family Medicine

## 2023-04-05 DIAGNOSIS — Z125 Encounter for screening for malignant neoplasm of prostate: Secondary | ICD-10-CM

## 2023-04-05 DIAGNOSIS — I10 Essential (primary) hypertension: Secondary | ICD-10-CM

## 2023-04-09 ENCOUNTER — Other Ambulatory Visit (INDEPENDENT_AMBULATORY_CARE_PROVIDER_SITE_OTHER): Payer: No Typology Code available for payment source

## 2023-04-09 DIAGNOSIS — Z125 Encounter for screening for malignant neoplasm of prostate: Secondary | ICD-10-CM | POA: Diagnosis not present

## 2023-04-09 DIAGNOSIS — I10 Essential (primary) hypertension: Secondary | ICD-10-CM

## 2023-04-09 LAB — COMPREHENSIVE METABOLIC PANEL
ALT: 17 U/L (ref 0–53)
AST: 24 U/L (ref 0–37)
Albumin: 4.4 g/dL (ref 3.5–5.2)
Alkaline Phosphatase: 48 U/L (ref 39–117)
BUN: 19 mg/dL (ref 6–23)
CO2: 29 mEq/L (ref 19–32)
Calcium: 9.1 mg/dL (ref 8.4–10.5)
Chloride: 103 mEq/L (ref 96–112)
Creatinine, Ser: 1.06 mg/dL (ref 0.40–1.50)
GFR: 76.77 mL/min (ref >60.00)
Glucose, Bld: 91 mg/dL (ref 70–99)
Potassium: 4.2 mEq/L (ref 3.5–5.1)
Sodium: 139 mEq/L (ref 135–145)
Total Bilirubin: 0.6 mg/dL (ref 0.2–1.2)
Total Protein: 6.8 g/dL (ref 6.0–8.3)

## 2023-04-09 LAB — CBC WITH DIFFERENTIAL/PLATELET
Basophils Absolute: 0.1 10*3/uL (ref 0.0–0.1)
Basophils Relative: 1.4 % (ref 0.0–3.0)
Eosinophils Absolute: 0.1 10*3/uL (ref 0.0–0.7)
Eosinophils Relative: 2.1 % (ref 0.0–5.0)
HCT: 40.9 % (ref 39.0–52.0)
Hemoglobin: 13.2 g/dL (ref 13.0–17.0)
Lymphocytes Relative: 23.8 % (ref 12.0–46.0)
Lymphs Abs: 1.4 10*3/uL (ref 0.7–4.0)
MCHC: 32.3 g/dL (ref 30.0–36.0)
MCV: 85.7 fl (ref 78.0–100.0)
Monocytes Absolute: 0.5 10*3/uL (ref 0.1–1.0)
Monocytes Relative: 8.6 % (ref 3.0–12.0)
Neutro Abs: 3.7 10*3/uL (ref 1.4–7.7)
Neutrophils Relative %: 64.1 % (ref 43.0–77.0)
Platelets: 236 10*3/uL (ref 150.0–400.0)
RBC: 4.78 Mil/uL (ref 4.22–5.81)
RDW: 16 % — ABNORMAL HIGH (ref 11.5–15.5)
WBC: 5.8 10*3/uL (ref 4.0–10.5)

## 2023-04-09 LAB — LIPID PANEL
Cholesterol: 146 mg/dL (ref 0–200)
HDL: 52.1 mg/dL (ref >39.00)
LDL Cholesterol: 82 mg/dL (ref 0–99)
NonHDL: 94.22
Total CHOL/HDL Ratio: 3
Triglycerides: 63 mg/dL (ref 0.0–149.0)
VLDL: 12.6 mg/dL (ref 0.0–40.0)

## 2023-04-09 LAB — PSA: PSA: 0.25 ng/mL (ref 0.10–4.00)

## 2023-04-16 ENCOUNTER — Ambulatory Visit (INDEPENDENT_AMBULATORY_CARE_PROVIDER_SITE_OTHER): Payer: No Typology Code available for payment source | Admitting: Family Medicine

## 2023-04-16 ENCOUNTER — Encounter: Payer: Self-pay | Admitting: Family Medicine

## 2023-04-16 VITALS — BP 122/80 | HR 60 | Temp 97.2°F | Ht 70.0 in | Wt 170.0 lb

## 2023-04-16 DIAGNOSIS — Z659 Problem related to unspecified psychosocial circumstances: Secondary | ICD-10-CM

## 2023-04-16 DIAGNOSIS — Z0000 Encounter for general adult medical examination without abnormal findings: Secondary | ICD-10-CM | POA: Diagnosis not present

## 2023-04-16 DIAGNOSIS — E800 Hereditary erythropoietic porphyria: Secondary | ICD-10-CM

## 2023-04-16 DIAGNOSIS — I10 Essential (primary) hypertension: Secondary | ICD-10-CM

## 2023-04-16 DIAGNOSIS — I671 Cerebral aneurysm, nonruptured: Secondary | ICD-10-CM

## 2023-04-16 DIAGNOSIS — Z7189 Other specified counseling: Secondary | ICD-10-CM

## 2023-04-16 MED ORDER — CLOBETASOL PROPIONATE 0.05 % EX CREA: TOPICAL_CREAM | CUTANEOUS | 12 refills | Status: AC

## 2023-04-16 MED ORDER — FLUTICASONE PROPIONATE 50 MCG/ACT NA SUSP: NASAL | 4 refills | Status: DC

## 2023-04-16 MED ORDER — LISINOPRIL 10 MG PO TABS: 10.0000 mg | ORAL_TABLET | Freq: Every day | ORAL | 3 refills | Status: DC

## 2023-04-16 MED ORDER — PROMETHEGAN 25 MG RE SUPP: RECTAL | 1 refills | Status: AC

## 2023-04-16 NOTE — Patient Instructions (Addendum)
Please update me about the name and dose the trial drug.  If your have lightheadedness with BP <120<80, then cut the lisinopril back to 5mg  a day. Take care.  Glad to see you. Update me as needed.

## 2023-04-16 NOTE — Progress Notes (Signed)
CPE- See plan.  Routine anticipatory guidance given to patient.  See health maintenance.  The possibility exists that previously documented standard health maintenance information may have been brought forward from a previous encounter into this note.  If needed, that same information has been updated to reflect the current situation based on today's encounter.    Tetanus 2020 Flu prev done.  PNA not due.  Shingles prev done per patient report.   covid vaccine 2021 Colonoscopy 01/19/2018- he has f/u pending.  PSA wnl.  Prostate cancer screening and PSA options (with potential risks and benefits of testing vs not testing) were discussed along with recent recs/guidelines.  He elects to keep testing PSA yearly.   Living will d/w pt. Wife designated if patient were incapacitated.  Diet and exercise. D/w pt. HCV neg 05/24/14 at [facility].  HIV screening prev done.    He is still on drug trial.  I asked him to update me about that.    His MGM had ALS. FH updated.  No other family members with ALS.  He lost weight with exercise.  He is playing pickleball.    He isn't having HA, double vision, new neuro sx.   Hypertension:    Using medication without problems or lightheadedness: yes Chest pain with exertion:no Edema:no Short of breath:no Labs d/w pt.   Other social stressors.  He had a merger at work.  Daughter is getting divorced.  Discussed.  He is able to manage as is.  He'll update me as needed.    PMH and SH reviewed  Meds, vitals, and allergies reviewed.   ROS: Per HPI.  Unless specifically indicated otherwise in HPI, the patient denies:  General: fever. Eyes: acute vision changes ENT: sore throat Cardiovascular: chest pain Respiratory: SOB GI: vomiting GU: dysuria Musculoskeletal: acute back pain Derm: acute rash Neuro: acute motor dysfunction Psych: worsening mood Endocrine: polydipsia Heme: bleeding Allergy: hayfever  GEN: nad, alert and oriented HEENT: ncat NECK: supple  w/o LA CV: rrr. PULM: ctab, no inc wob ABD: soft, +bs EXT: no edema SKIN: no acute rash

## 2023-04-19 ENCOUNTER — Encounter: Payer: Self-pay | Admitting: Family Medicine

## 2023-04-19 DIAGNOSIS — Z659 Problem related to unspecified psychosocial circumstances: Secondary | ICD-10-CM | POA: Insufficient documentation

## 2023-04-19 NOTE — Assessment & Plan Note (Signed)
He is still on drug trial.  I asked him to update me about that medication.

## 2023-04-19 NOTE — Assessment & Plan Note (Signed)
History of, up-to-date on imaging. He isn't having HA, double vision, new neuro sx.

## 2023-04-19 NOTE — Assessment & Plan Note (Signed)
Tetanus 2020 Flu prev done.  PNA not due.  Shingles prev done per patient report.   covid vaccine 2021 Colonoscopy 01/19/2018- he has f/u pending.  PSA wnl.  Prostate cancer screening and PSA options (with potential risks and benefits of testing vs not testing) were discussed along with recent recs/guidelines.  He elects to keep testing PSA yearly.   Living will d/w pt. Wife designated if patient were incapacitated.  Diet and exercise. D/w pt. HCV neg 05/24/14 at [facility].  HIV screening prev done.

## 2023-04-19 NOTE — Assessment & Plan Note (Signed)
Continue work on diet and exercise.  Continue lisinopril.  Labs discussed with patient.

## 2023-04-19 NOTE — Assessment & Plan Note (Signed)
Other social stressors.  He had a merger at work.  Daughter is getting divorced.  Discussed.  He is able to manage as is.  He'll update me as needed.

## 2023-04-19 NOTE — Assessment & Plan Note (Signed)
Living will d/w pt.  Wife designated if patient were incapacitated.   ?

## 2023-04-22 ENCOUNTER — Encounter: Payer: Self-pay | Admitting: Family Medicine

## 2023-07-31 ENCOUNTER — Encounter: Payer: Self-pay | Admitting: Family Medicine

## 2023-08-03 ENCOUNTER — Ambulatory Visit (INDEPENDENT_AMBULATORY_CARE_PROVIDER_SITE_OTHER): Payer: No Typology Code available for payment source | Admitting: Family Medicine

## 2023-08-03 VITALS — BP 112/80 | HR 67 | Temp 97.7°F | Ht 70.0 in | Wt 165.0 lb

## 2023-08-03 DIAGNOSIS — M25519 Pain in unspecified shoulder: Secondary | ICD-10-CM | POA: Insufficient documentation

## 2023-08-03 NOTE — Assessment & Plan Note (Signed)
Triceps and RTC anatomy d/w pt, concern for both being affected.  D/w pt about pendulum swings and PT eval.  He agrees with plan.

## 2023-08-03 NOTE — Patient Instructions (Signed)
Arm swings in the meantime and you should get a call about seeing PT.  Take care.  Glad to see you.

## 2023-08-03 NOTE — Addendum Note (Signed)
Addended by: Joaquim Nam on: 08/03/2023 05:27 PM   Modules accepted: Level of Service

## 2023-08-03 NOTE — Progress Notes (Signed)
R arm pain near the triceps for a few weeks.  R handed.  No L arm sx.  Had been playing pickleball a lot prev.  Didn't feel a pop or snap.  No trauma.  Pain with overhead shot, with tricep use and elbow extension.  No bruising.  Also had been weed eating for about 2 hours in an awkward position with pain the next day but not at the time of yardwork.  Pain sleeping on R side.  Sore near the R shoulder and just distal to the R elbow.    Lump in the R lateral upper arm, proximal to elbow.  Longstanding, did not start with the above.    Meds, vitals, and allergies reviewed.   ROS: Per HPI unless specifically indicated in ROS section   Nad Ncat Neck supple no LA Likely lipoma on the R arm, proximal to the lateral elbow R arm w/o bruising.   R triceps tender. Biceps not ttp R AC not sore on testing.   Pain wtih R int and ext rotation.  No arm drop.   Normal sensation and grip  Normal radial pulse.  R elbow itself not ttp.

## 2023-08-06 ENCOUNTER — Encounter: Payer: Self-pay | Admitting: Physical Therapy

## 2023-08-06 ENCOUNTER — Ambulatory Visit: Payer: No Typology Code available for payment source | Attending: Family Medicine | Admitting: Physical Therapy

## 2023-08-06 DIAGNOSIS — M25519 Pain in unspecified shoulder: Secondary | ICD-10-CM | POA: Insufficient documentation

## 2023-08-06 DIAGNOSIS — M25511 Pain in right shoulder: Secondary | ICD-10-CM | POA: Insufficient documentation

## 2023-08-06 DIAGNOSIS — G8929 Other chronic pain: Secondary | ICD-10-CM | POA: Diagnosis present

## 2023-08-06 NOTE — Therapy (Addendum)
OUTPATIENT PHYSICAL THERAPY SHOULDER EVALUATION   Patient Name: Douglas Mathews MRN: [id number] DOB:Mar 24, 1961, 62 y.o., male Today's Date: 08/06/2023  END OF SESSION:  PT End of Session - 08/06/23 1320     Visit Number 1    Number of Visits 20    Date for PT Re-Evaluation 10/15/23    Authorization Type [employer] [id number]    Authorization - Visit Number 1    Authorization - Number of Visits 20    Progress Note Due on Visit 10    PT Start Time 1115    PT Stop Time 1200    PT Time Calculation (min) 45 min    Activity Tolerance Patient tolerated treatment well    Behavior During Therapy [facility] for tasks assessed/performed             Past Medical History:  Diagnosis Date   GI bleed    due to bleeding polyp   History of MRI 03/03/2004   head, nml ([facility])   Hypertension    Rash    on buttock, respods to clobetasol   Past Surgical History:  Procedure Laterality Date   CEREBRAL ANEURYSM REPAIR  07/2018   comm aneurysm clipping done at [facility] 2019   [facility]  06/07-06/07/2009   Anemia due to blood loss hypokalemia Erythr prophyria GI bleed from polyp   VASECTOMY  04/03/2000   [facility])   Patient Active Problem List   Diagnosis Date Noted   Shoulder pain 08/03/2023   Other social stressor 04/19/2023   Nausea 03/02/2022   Syncope 10/26/2019   Cerebral aneurysm 11/10/2018   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 Crawford Givens   REFERRING PROVIDER: Dr. Crawford Givens    REFERRING DIAG: M25.519 (ICD-10-CM) - Shoulder pain, unspecified chronicity, unspecified laterality  THERAPY DIAG:  Chronic  right shoulder pain - Plan: PT plan of care cert/re-cert  Rationale for Evaluation and Treatment: Rehabilitation  ONSET DATE: 05/06/23  SUBJECTIVE:                                                                                                                                                                                      SUBJECTIVE STATEMENT: See pertinent history  Hand dominance: Right  PERTINENT HISTORY: Pt reports that incidence happened a couple of months ago when he was playing  pickle ball. He was going for a forehand and he felt resulting pain radiating down right shoulder into lateral side of arm. He had another incident when he was weed eating and this has aggravated the right shoulder pain. He has been resting his shoulder from some time now along with icing it, but this has not helped much.   PAIN:  Are you having pain? Yes: NPRS scale: 3-4/10 Pain location: AC joint of right shoulder  Pain description: Throbbing  Aggravating factors: Internal rotation is the worst  Relieving factors: Keeping right arm on pillow while sleeping   PRECAUTIONS: None  RED FLAGS: None   WEIGHT BEARING RESTRICTIONS: No  FALLS:  Has patient fallen in last 6 months? 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  VITALS BP 129/86 HR 69 SpO2 100   DIAGNOSTIC FINDINGS:  None listed for the shoulder   PATIENT SURVEYS:  FOTO 55/100 with target of 72   COGNITION: Overall cognitive status: Within functional limits  for tasks assessed     SENSATION: WFL  POSTURE: Forward head and rounded shoulders  UPPER EXTREMITY ROM:   Active ROM Right eval Left eval  Shoulder flexion    Shoulder extension    Shoulder abduction    Shoulder adduction    Shoulder internal rotation    Shoulder external rotation    Elbow flexion    Elbow extension    Wrist flexion    Wrist extension    Wrist ulnar deviation    Wrist radial deviation    Wrist pronation    Wrist supination    Combined External Rotation   Occiput Occiput   Combined Internal Rotation  Right Ischial Tub L4/L5   (Blank rows = not tested)  UPPER EXTREMITY MMT:  MMT Right eval Left eval  Shoulder flexion 4 4+  Shoulder extension 4+ 4+  Shoulder abduction 4 4+  Shoulder adduction    Shoulder internal rotation    Shoulder external rotation    Middle trapezius 4 4  Lower trapezius 4-* 4  Elbow flexion    Elbow extension    Wrist flexion    Wrist extension    Wrist ulnar deviation    Wrist radial deviation    Wrist pronation    Wrist supination    Grip strength (lbs)    (Blank rows = not tested)  SHOULDER SPECIAL TESTS: Impingement tests: Neer impingement test: positive  and Painful arc test: positive  SLAP lesions:  N/a Instability tests:  N/a Rotator cuff assessment: Drop arm test: negative, Empty can test: positive , Full can test: positive , Belly press test: positive , and Infraspinatus test: negative Biceps assessment: Speed's test: positive   JOINT MOBILITY TESTING:  Not assessed   PALPATION:  Right bicipital groove ttp   TODAY'S TREATMENT:  DATE:   08/06/23: Shoulder Flexion/Extension AAROM with pulleys 1 x 30  Shoulder Abduction/Adduction AAROM with pulleys 1 x 30  Eccentric Bicep Flexion on RUE with yellow band 1 x 10  -min VC to decrease speed of lowering arm    PATIENT  EDUCATION: Education details: form and technique for correct performance of exercise.  Person educated: Patient Education method: Explanation, Demonstration, Verbal cues, and Handouts Education comprehension: verbalized understanding, returned demonstration, and verbal cues required  HOME EXERCISE PROGRAM: Access Code: [email or web address] URL: [email or web address] Date: 08/06/2023 Prepared by: Ellin Goodie  Exercises - Seated Shoulder Flexion AAROM with Pulley Behind  - 1 x daily - 3 sets - 10 reps - Seated Shoulder Abduction AAROM with Pulley Behind  - 1 x daily - 3 sets - 10 reps - Standing Shoulder Flexion with Resistance  - 3-4 x weekly - 3 sets - 10 reps  ASSESSMENT:  CLINICAL IMPRESSION: Patient is a 62 y.o. white male who was seen today for physical therapy evaluation and treatment for chronic right shoulder pain. 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 He will benefit from skilled PT to address these aforementioned deficits to return to playing pickle ball and yard work.   OBJECTIVE IMPAIRMENTS: decreased strength, impaired UE functional use, and pain.   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   REHAB POTENTIAL: Good  CLINICAL DECISION MAKING: Stable/uncomplicated  EVALUATION COMPLEXITY: Low   GOALS: Goals reviewed with patient? No  SHORT TERM GOALS: Target date: 08/20/2023  PT reviewed the following HEP with patient with patient able to demonstrate a set of the following with min cuing for correction needed. PT educated patient on parameters of therex (how/when  to inc/decrease intensity, frequency, rep/set range, stretch hold time, and purpose of therex) with verbalized understanding.  Baseline: NT  Goal status: INITIAL   LONG TERM GOALS: Target date: 10/15/2023  Patient will have improved function and activity level as evidenced by an increase in FOTO score by 10 points or more.  Baseline: 55 with target of 72  Goal status: INITIAL  2.  Patient will improve right shoulder strength to be near symmetrical to left shoulder for improved right shoulder function to return to playing pickle ball.  Baseline: Shoulder R/L Flex 4/4+, Abd 4/4+, Lower Trap 4-/4 Goal status: INITIAL  3.  Patient will improve right shoulder AROM to be near symmetrical to left shoulder for improved right shoulder function to return to playing pickle ball and completing yard work.  Baseline: Combined IR R/L Right ischial tuberosity/ L4-L5 vertebrae  Goal status: INITIAL   PLAN:  PT FREQUENCY: 1-2x/week  PT DURATION: 10 weeks  PLANNED INTERVENTIONS: Therapeutic exercises, Therapeutic activity, Self Care, Joint mobilization, Joint manipulation, Aquatic Therapy, Dry Needling, Electrical stimulation, Spinal manipulation, Spinal mobilization, Cryotherapy, Moist heat, Taping, Manual therapy, and Re-evaluation  PLAN FOR NEXT SESSION: Progress periscapular strength and assess cervical mobility.    Ellin Goodie PT, DPT  [facility] [address] [place], [address] Phone: [phone]   Fax:  [phone]

## 2023-08-10 ENCOUNTER — Encounter: Payer: Self-pay | Admitting: Physical Therapy

## 2023-08-10 ENCOUNTER — Ambulatory Visit: Payer: No Typology Code available for payment source | Admitting: Physical Therapy

## 2023-08-10 DIAGNOSIS — M25511 Pain in right shoulder: Secondary | ICD-10-CM | POA: Diagnosis not present

## 2023-08-10 DIAGNOSIS — G8929 Other chronic pain: Secondary | ICD-10-CM

## 2023-08-10 NOTE — Therapy (Signed)
OUTPATIENT PHYSICAL THERAPY SHOULDER TREATMENT    Patient Name: Douglas Mathews MRN: [id number] DOB:August 31, 1961, 62 y.o., male Today's Date: 08/10/2023  END OF SESSION:  PT End of Session - 08/10/23 1435     Visit Number 2    Number of Visits 20    Date for PT Re-Evaluation 10/15/23    Authorization Type [employer] [id number]    Authorization - Visit Number 2    Authorization - Number of Visits 20    Progress Note Due on Visit 10    PT Start Time 1430    PT Stop Time 1515    PT Time Calculation (min) 45 min    Activity Tolerance Patient tolerated treatment well    Behavior During Therapy [facility] for tasks assessed/performed             Past Medical History:  Diagnosis Date   GI bleed    due to bleeding polyp   History of MRI 03/03/2004   head, nml ([facility])   Hypertension    Rash    on buttock, respods to clobetasol   Past Surgical History:  Procedure Laterality Date   CEREBRAL ANEURYSM REPAIR  07/2018   comm aneurysm clipping done at [facility] 2019   [facility])  06/07-06/07/2009   Anemia due to blood loss hypokalemia Erythr prophyria GI bleed from polyp   VASECTOMY  04/03/2000   [facility])   Patient Active Problem List   Diagnosis Date Noted   Shoulder pain 08/03/2023   Other social stressor 04/19/2023   Nausea 03/02/2022   Syncope 10/26/2019   Cerebral aneurysm 11/10/2018   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 Crawford Givens   REFERRING PROVIDER: Dr. Crawford Givens    REFERRING DIAG: M25.519 (ICD-10-CM) - Shoulder pain, unspecified chronicity, unspecified laterality  THERAPY DIAG:  Chronic  right shoulder pain  Rationale for Evaluation and Treatment: Rehabilitation  ONSET DATE: 05/06/23  SUBJECTIVE:                                                                                                                                                                                      SUBJECTIVE STATEMENT: Pt reports that he felt like the pulley exercises helped with moving his arm.  See pertinent history  Hand dominance: Right  PERTINENT HISTORY: Pt reports that incidence happened  a couple of months ago when he was playing pickle ball. He was going for a forehand and he felt resulting pain radiating down right shoulder into lateral side of arm. He had another incident when he was weed eating and this has aggravated the right shoulder pain. He has been resting his shoulder from some time now along with icing it, but this has not helped much.   PAIN:  Are you having pain? Yes: NPRS scale: 3-4/10 Pain location: AC joint of right shoulder  Pain description: Throbbing  Aggravating factors: Internal rotation is the worst  Relieving factors: Keeping right arm on pillow while sleeping   PRECAUTIONS: None  RED FLAGS: None   WEIGHT BEARING RESTRICTIONS: No  FALLS:  Has patient fallen in last 6 months? 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  VITALS BP 129/86 HR 69 SpO2 100   DIAGNOSTIC FINDINGS:  None listed for the shoulder   PATIENT SURVEYS:  FOTO 55/100 with target of 72   COGNITION: Overall  cognitive status: Within functional limits for tasks assessed     SENSATION: WFL  POSTURE: Forward head and rounded shoulders  UPPER EXTREMITY ROM:   Active ROM Right eval Left eval  Shoulder flexion    Shoulder extension    Shoulder abduction    Shoulder adduction    Shoulder internal rotation    Shoulder external rotation    Elbow flexion    Elbow extension    Wrist flexion    Wrist extension    Wrist ulnar deviation    Wrist radial deviation    Wrist pronation    Wrist supination    Combined External Rotation   Occiput Occiput   Combined Internal Rotation  Right Ischial Tub L4/L5   (Blank rows = not tested)  UPPER EXTREMITY MMT:  MMT Right eval Left eval  Shoulder flexion 4 4+  Shoulder extension 4+ 4+  Shoulder abduction 4 4+  Shoulder adduction    Shoulder internal rotation    Shoulder external rotation    Middle trapezius 4 4  Lower trapezius 4-* 4  Elbow flexion    Elbow extension    Wrist flexion    Wrist extension    Wrist ulnar deviation    Wrist radial deviation    Wrist pronation    Wrist supination    Grip strength (lbs)    (Blank rows = not tested)  SHOULDER SPECIAL TESTS: Impingement tests: Neer impingement test: positive  and Painful arc test: positive  SLAP lesions:  N/a Instability tests:  N/a Rotator cuff assessment: Drop arm test: negative, Empty can test: positive , Full can test: positive , Belly press test: positive , and Infraspinatus test: negative Biceps assessment: Speed's test: positive   JOINT MOBILITY TESTING:  Not assessed   PALPATION:  Right bicipital groove ttp   TODAY'S TREATMENT:  DATE:   08/10/23: UBE seat at 9 with resistance at 3 for 5 min  OMEGA Seated Rows #25 1 x 10  OMEGA Seated Rows #35 2 x 10  OMEGA Lat Pull Downs #35 3 x 10  Bent Over Rows with #15  -Pt reports  increased pain in posterior arm along tricep  Resisted Shoulder Extension on RLE  -Pt reports no increased pain  Bent Over Rows with #10 DB 3 x 10    Cervical AROM: -Flex 45 deg  -Ext 45 deg  -Rot R/L 60/60  -Side Bend R/L 30/25   Upper Trap Stretch 2 x 30 sec  -min VC for sequence of exercise. D2 PNF Flexion and Extension 2 x 10  D2 PNF Flexion and Extension with yellow band 2 x 10   08/06/23: Shoulder Flexion/Extension AAROM with pulleys 1 x 30  Shoulder Abduction/Adduction AAROM with pulleys 1 x 30  Eccentric Bicep Flexion on RUE with yellow band 1 x 10  -min VC to decrease speed of lowering arm    PATIENT EDUCATION: Education details: form and technique for correct performance of exercise.  Person educated: Patient Education method: Explanation, Demonstration, Verbal cues, and Handouts Education comprehension: verbalized understanding, returned demonstration, and verbal cues required  HOME EXERCISE PROGRAM: Access Code: [email or web address] URL: [email or web address] Date: 08/10/2023 Prepared by: Ellin Goodie  Exercises - Seated Upper Trapezius Stretch  - 1 x daily - 3 reps - 30-60 sec hold - Seated Shoulder Flexion AAROM with Pulley Behind  - 1 x daily - 3 sets - 10 reps - Seated Shoulder Abduction AAROM with Pulley Behind  - 1 x daily - 3 sets - 10 reps - Standing Shoulder Flexion with Resistance  - 3-4 x weekly - 3 sets - 10 reps - Standing Bent Over Single Arm Scapular Row with Table Support  - 3-4 x weekly - 3 sets - 10 reps - Standing Shoulder Single Arm PNF D2 Flexion with Resistance  - 3-4 x weekly - 3 sets - 10 reps  ASSESSMENT:  CLINICAL IMPRESSION: Pt demonstrates decreased cervical ROM likely due to increased muscular tension in upper traps. He was able to tolerate all periscapular strengthening exercises with exception of bent over rows which triggered concordant pain. Concordant pain is not being generated by his triceps. He will continue to   benefit from skilled PT to address these aforementioned deficits to return to playing pickle ball and yard work.     OBJECTIVE IMPAIRMENTS: decreased strength, impaired UE functional use, and pain.   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   REHAB POTENTIAL: Good  CLINICAL DECISION MAKING: Stable/uncomplicated  EVALUATION COMPLEXITY: Low   GOALS: Goals reviewed with patient? No  SHORT TERM GOALS: Target date: 08/20/2023  PT reviewed the following HEP with patient with patient able to demonstrate a set of the following with min cuing for correction needed. PT educated patient on parameters of therex (how/when to inc/decrease intensity, frequency, rep/set range, stretch hold time, and purpose of therex) with verbalized understanding.  Baseline: NT  Goal status: ONGOING    LONG TERM GOALS: Target date: 10/15/2023  Patient will have improved function and activity level as evidenced by an increase in FOTO score by 10 points or more.  Baseline: 55 with target of 72  Goal status: ONGOING   2.  Patient will improve right shoulder strength to be near symmetrical to left shoulder for improved right shoulder function to return to playing pickle ball.  Baseline: Shoulder R/L Flex 4/4+, Abd 4/4+, Lower Trap 4-/4 Goal status: ONGOING   3.  Patient will improve right shoulder AROM to be near symmetrical to left shoulder for improved right shoulder function to return to playing pickle ball and completing yard work.  Baseline: Combined IR R/L Right ischial tuberosity/ L4-L5 vertebrae  Goal status: ONGOING    PLAN:  PT FREQUENCY: 1-2x/week  PT DURATION: 10 weeks  PLANNED INTERVENTIONS: Therapeutic exercises, Therapeutic activity, Self Care, Joint mobilization, Joint  manipulation, Aquatic Therapy, Dry Needling, Electrical stimulation, Spinal manipulation, Spinal mobilization, Cryotherapy, Moist heat, Taping, Manual therapy, and Re-evaluation  PLAN FOR NEXT SESSION: Measure PROM of cervical side bending. Progress periscapular strength and cervical mobility: side bend self mobilizations and serratus wall rolls     Ellin Goodie PT, DPT  [facility] [address] [place], [address] Phone: [phone]   Fax:  [phone]

## 2023-08-12 ENCOUNTER — Ambulatory Visit: Payer: No Typology Code available for payment source | Admitting: Physical Therapy

## 2023-08-17 ENCOUNTER — Encounter: Payer: Self-pay | Admitting: Physical Therapy

## 2023-08-17 ENCOUNTER — Ambulatory Visit: Payer: No Typology Code available for payment source | Admitting: Physical Therapy

## 2023-08-17 DIAGNOSIS — M25511 Pain in right shoulder: Secondary | ICD-10-CM | POA: Diagnosis not present

## 2023-08-17 DIAGNOSIS — G8929 Other chronic pain: Secondary | ICD-10-CM

## 2023-08-17 NOTE — Therapy (Signed)
OUTPATIENT PHYSICAL THERAPY SHOULDER TREATMENT    Patient Name: Douglas Mathews MRN: [id number] DOB:12-02-1960, 62 y.o., male Today's Date: 08/17/2023  END OF SESSION:  PT End of Session - 08/17/23 0950     Visit Number 3    Number of Visits 20    Date for PT Re-Evaluation 10/15/23    Authorization Type Aetna [id number]    Authorization - Visit Number 3    Authorization - Number of Visits 20    Progress Note Due on Visit 10    PT Start Time 0948    PT Stop Time 1030    PT Time Calculation (min) 42 min    Activity Tolerance Patient tolerated treatment well    Behavior During Therapy [facility] for tasks assessed/performed             Past Medical History:  Diagnosis Date   GI bleed    due to bleeding polyp   History of MRI 03/03/2004   head, nml ([facility])   Hypertension    Rash    on buttock, respods to clobetasol   Past Surgical History:  Procedure Laterality Date   CEREBRAL ANEURYSM REPAIR  07/2018   comm aneurysm clipping [facility] 2019   [facility]  06/07-06/07/2009   Anemia due to blood loss hypokalemia Erythr prophyria GI bleed from polyp   VASECTOMY  04/03/2000   [facility])   Patient Active Problem List   Diagnosis Date Noted   Shoulder pain 08/03/2023   Other social stressor 04/19/2023   Nausea 03/02/2022   Syncope 10/26/2019   Cerebral aneurysm 11/10/2018   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 Crawford Givens   REFERRING PROVIDER: Dr. Crawford Givens    REFERRING DIAG: M25.519 (ICD-10-CM) - Shoulder pain, unspecified chronicity, unspecified laterality  THERAPY DIAG:  Chronic  right shoulder pain  Rationale for Evaluation and Treatment: Rehabilitation  ONSET DATE: 05/06/23  SUBJECTIVE:                                                                                                                                                                                      SUBJECTIVE STATEMENT: Pt states that he felt increased right shoulder pain over the weekend after tripping and then catching himself with his RUE. He experiences most of the pain in his triceps.  See pertinent history  Hand dominance: Right  PERTINENT HISTORY: Pt reports that incidence happened a couple of months ago when he was playing pickle ball. He was going for a forehand and he felt resulting pain radiating down right shoulder into lateral side of arm. He had another incident when he was weed eating and this has aggravated the right shoulder pain. He has been resting his shoulder from some time now along with icing it, but this has not helped much.   PAIN:  Are you having pain? Yes: NPRS scale: 3-4/10 Pain location: AC joint of right shoulder  Pain description: Throbbing  Aggravating factors: Internal rotation is the worst  Relieving factors: Keeping right arm on pillow while sleeping   PRECAUTIONS: None  RED FLAGS: None   WEIGHT BEARING RESTRICTIONS: No  FALLS:  Has patient fallen in last 6 months? 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  VITALS BP 129/86 HR 69 SpO2 100   DIAGNOSTIC FINDINGS:  None listed  for the shoulder   PATIENT SURVEYS:  FOTO 55/100 with target of 72   COGNITION: Overall cognitive status: Within functional limits for tasks assessed     SENSATION: WFL  POSTURE: Forward head and rounded shoulders  UPPER EXTREMITY ROM:   Active ROM Right eval Left eval  Shoulder flexion    Shoulder extension    Shoulder abduction    Shoulder adduction    Shoulder internal rotation    Shoulder external rotation    Elbow flexion    Elbow extension    Wrist flexion    Wrist extension    Wrist ulnar deviation    Wrist radial deviation    Wrist pronation    Wrist supination    Combined External Rotation   Occiput Occiput   Combined Internal Rotation  Right Ischial Tub L4/L5   (Blank rows = not tested)  UPPER EXTREMITY MMT:  MMT Right eval Left eval  Shoulder flexion 4 4+  Shoulder extension 4+ 4+  Shoulder abduction 4 4+  Shoulder adduction    Shoulder internal rotation    Shoulder external rotation    Middle trapezius 4 4  Lower trapezius 4-* 4  Elbow flexion    Elbow extension    Wrist flexion    Wrist extension    Wrist ulnar deviation    Wrist radial deviation    Wrist pronation    Wrist supination    Grip strength (lbs)    (Blank rows = not tested)  SHOULDER SPECIAL TESTS: Impingement tests: Neer impingement test: positive  and Painful arc test: positive  SLAP lesions:  N/a Instability tests:  N/a Rotator cuff assessment: Drop arm test: negative, Empty can test: positive , Full can test: positive , Belly press test: positive , and Infraspinatus test: negative Biceps assessment: Speed's test: positive   JOINT MOBILITY TESTING:  Not assessed   PALPATION:  Right bicipital groove ttp   TODAY'S TREATMENT:  DATE:   08/17/23 All single arm exercises RUE  UBE seat at 9 with resistance at 3 at 2.5 min forward and  2.5 min backward  OMEGA Single Arm Row #5 lbs on OMEGA machine 2 x 10  Single Arm Row with yellow band 1 x 10  OMEGA Single Arm Lat Pull Down with #5 2 x 10 OMEGA Single Arm Lat Pull Down with yellow band 1 x 10 -min VC to maintain elbow extended for  LUE Shoulder Flexion Eccentric with yellow band 1 x 10  LUE Shoulder Flexion Eccentric with red band 1 x 10  LUE Shoulder Flexion Eccentric with yellow band 3 x 10   08/10/23: UBE seat at 9 with resistance at 3 for 5 min  OMEGA Seated Rows #25 1 x 10  OMEGA Seated Rows #35 2 x 10  OMEGA Lat Pull Downs #35 3 x 10  Bent Over Rows with #15  -Pt reports increased pain in posterior arm along tricep  Resisted Shoulder Extension on RLE  -Pt reports no increased pain  Bent Over Rows with #10 DB 3 x 10    Cervical AROM: -Flex 45 deg  -Ext 45 deg  -Rot R/L 60/60  -Side Bend R/L 30/25   Upper Trap Stretch 2 x 30 sec  -min VC for sequence of exercise. D2 PNF Flexion and Extension 2 x 10  D2 PNF Flexion and Extension with yellow band 2 x 10   08/06/23: Shoulder Flexion/Extension AAROM with pulleys 1 x 30  Shoulder Abduction/Adduction AAROM with pulleys 1 x 30  Eccentric Bicep Flexion on RUE with yellow band 1 x 10  -min VC to decrease speed of lowering arm    PATIENT EDUCATION: Education details: form and technique for correct performance of exercise.  Person educated: Patient Education method: Explanation, Demonstration, Verbal cues, and Handouts Education comprehension: verbalized understanding, returned demonstration, and verbal cues required  HOME EXERCISE PROGRAM: Access Code: [email or web address] URL: [email or web address] Date: 08/17/2023 Prepared by: Ellin Goodie  Exercises - Seated Upper Trapezius Stretch  - 1 x daily - 3 reps - 30-60 sec hold - Seated Shoulder Flexion AAROM with Pulley Behind  - 1 x daily - 3 sets - 10 reps - Seated Shoulder Abduction AAROM with Pulley Behind  - 1 x daily - 3 sets - 10 reps -  Standing Shoulder Flexion with Resistance  - 3-4 x weekly - 3 sets - 10 reps - Standing Single Arm Shoulder Abduction with Resistance  - 3-4 x weekly - 3 sets - 10 reps - Standing Shoulder Single Arm PNF D2 Flexion with Resistance  - 3-4 x weekly - 3 sets - 10 reps - Standing Single Arm Row with Resistance Thumb Up  - 3-4 x weekly - 3 sets - 10 reps - Standing Lat Pull Down with Resistance-Elbows Extension  - 1 x daily - 3-4 x weekly - 3 sets - 10 reps  ASSESSMENT:  CLINICAL IMPRESSION: Pt shows improvement in LUE strength and pain tolerance with ability to perform single leg UE exercises with less increase in his pain. He does continue to show referred pain down lateral side of right shoulder when performing lateral raises. He will continue to  benefit from skilled PT to address these aforementioned deficits to return to playing pickle ball and yard work.  OBJECTIVE IMPAIRMENTS: decreased strength, impaired UE functional use, and pain.   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   REHAB POTENTIAL: Good  CLINICAL DECISION MAKING: Stable/uncomplicated  EVALUATION COMPLEXITY: Low   GOALS: Goals reviewed with patient? No  SHORT TERM GOALS: Target date: 08/20/2023  PT reviewed the following HEP with patient with patient able to demonstrate a set of the following with min cuing for correction needed. PT educated patient on parameters of therex (how/when to inc/decrease intensity, frequency, rep/set range, stretch hold time, and purpose of therex) with verbalized understanding.  Baseline: NT  Goal status: ONGOING    LONG TERM GOALS: Target date: 10/15/2023  Patient will have improved function and activity level as evidenced by an increase in FOTO score by 10 points or more.   Baseline: 55 with target of 72  Goal status: ONGOING   2.  Patient will improve right shoulder strength to be near symmetrical to left shoulder for improved right shoulder function to return to playing pickle ball.  Baseline: Shoulder R/L Flex 4/4+, Abd 4/4+, Lower Trap 4-/4 Goal status: ONGOING   3.  Patient will improve right shoulder AROM to be near symmetrical to left shoulder for improved right shoulder function to return to playing pickle ball and completing yard work.  Baseline: Combined IR R/L Right ischial tuberosity/ L4-L5 vertebrae  Goal status: ONGOING    PLAN:  PT FREQUENCY: 1-2x/week  PT DURATION: 10 weeks  PLANNED INTERVENTIONS: Therapeutic exercises, Therapeutic activity, Self Care, Joint mobilization, Joint manipulation, Aquatic Therapy, Dry Needling, Electrical stimulation, Spinal manipulation, Spinal mobilization, Cryotherapy, Moist heat, Taping, Manual therapy, and Re-evaluation  PLAN FOR NEXT SESSION: Measure PROM of cervical side bending. Progress periscapular strength and cervical mobility: serratus wall rolls and progress D2 PNF Flexion    Ellin Goodie PT, DPT  [facility] [address] [place], [address] Phone: [phone]   Fax:  [phone]

## 2023-08-20 ENCOUNTER — Ambulatory Visit: Payer: No Typology Code available for payment source | Admitting: Physical Therapy

## 2023-08-24 ENCOUNTER — Encounter: Payer: No Typology Code available for payment source | Admitting: Physical Therapy

## 2023-08-27 ENCOUNTER — Encounter: Payer: No Typology Code available for payment source | Admitting: Physical Therapy

## 2023-08-31 ENCOUNTER — Encounter: Payer: No Typology Code available for payment source | Admitting: Physical Therapy

## 2023-09-02 ENCOUNTER — Ambulatory Visit: Payer: No Typology Code available for payment source | Admitting: Physical Therapy

## 2023-09-07 ENCOUNTER — Encounter: Payer: Self-pay | Admitting: Physical Therapy

## 2023-09-07 ENCOUNTER — Ambulatory Visit: Payer: No Typology Code available for payment source | Attending: Family Medicine | Admitting: Physical Therapy

## 2023-09-07 DIAGNOSIS — M25511 Pain in right shoulder: Secondary | ICD-10-CM | POA: Insufficient documentation

## 2023-09-07 DIAGNOSIS — G8929 Other chronic pain: Secondary | ICD-10-CM

## 2023-09-07 NOTE — Therapy (Addendum)
OUTPATIENT PHYSICAL THERAPY SHOULDER DISCHARGE    Patient Name: Douglas Mathews MRN: [id number] DOB:Jun 29, 1961, 62 y.o., male Today's Date: 09/07/2023  END OF SESSION:  PT End of Session - 09/07/23 1032     Visit Number 4    Number of Visits 20    Date for PT Re-Evaluation 10/15/23    Authorization Type [employer] [id number]    Authorization - Visit Number 4    Authorization - Number of Visits 20    Progress Note Due on Visit 10    PT Start Time 1030    PT Stop Time 1100    PT Time Calculation (min) 30 min    Activity Tolerance Patient tolerated treatment well    Behavior During Therapy WFL for tasks assessed/performed             Past Medical History:  Diagnosis Date   GI bleed    due to bleeding polyp   History of MRI 03/03/2004   head, nml ([facility])   Hypertension    Rash    on buttock, respods to clobetasol   Past Surgical History:  Procedure Laterality Date   CEREBRAL ANEURYSM REPAIR  07/2018   comm aneurysm clipping done at [facility] 2019   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 Crawford Givens   REFERRING PROVIDER: Dr. Crawford Givens    REFERRING DIAG: M25.519 (ICD-10-CM) - Shoulder pain, unspecified chronicity, unspecified laterality  THERAPY DIAG:  Chronic  right shoulder pain  Rationale for Evaluation and Treatment: Rehabilitation  ONSET DATE: 05/06/23  SUBJECTIVE:                                                                                                                                                                                      SUBJECTIVE STATEMENT: Pt reports that his right arm pain has improved since last visit to the point where he is able to sleep on his right side now.  See pertinent history  Hand dominance: Right  PERTINENT HISTORY: Pt reports that incidence happened a couple of months ago when he was playing pickle ball. He was going for a forehand and he felt resulting pain radiating down right shoulder into lateral side of arm. He had another incident when he was weed eating and this has aggravated the right shoulder pain. He has been resting his shoulder from some time now along with icing it, but this has not helped much.   PAIN:  Are you having pain? Yes: NPRS scale: 3-4/10 Pain location: AC joint of right shoulder  Pain description: Throbbing  Aggravating factors: Internal rotation is the worst  Relieving factors: Keeping right arm on pillow while sleeping   PRECAUTIONS: None  RED FLAGS: None   WEIGHT BEARING RESTRICTIONS: No  FALLS:  Has patient fallen in last 6 months? 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  VITALS BP 129/86 HR 69 SpO2 100   DIAGNOSTIC FINDINGS:  None listed for the shoulder   PATIENT SURVEYS:  FOTO  55/100 with target of 72   COGNITION: Overall cognitive status: Within functional limits for tasks assessed     SENSATION: WFL  POSTURE: Forward head and rounded shoulders  UPPER EXTREMITY ROM:   Active ROM Right eval Left eval Right  09/07/23 Left  09/07/23   Shoulder flexion      Shoulder extension      Shoulder abduction      Shoulder adduction      Shoulder internal rotation      Shoulder external rotation      Elbow flexion      Elbow extension      Wrist flexion      Wrist extension      Wrist ulnar deviation      Wrist radial deviation      Wrist pronation      Wrist supination      Combined External Rotation   Occiput Occiput     Combined Internal Rotation  Right Ischial Tub L4/L5  L4/L5  T12   (Blank rows = not tested)  UPPER EXTREMITY MMT:  MMT Right eval Left eval  Shoulder flexion 4 4+  Shoulder extension 4+ 4+  Shoulder abduction 4 4+  Shoulder adduction    Shoulder internal rotation    Shoulder external rotation    Middle trapezius 4 4  Lower trapezius 4-* 4  Elbow flexion    Elbow extension    Wrist flexion    Wrist extension    Wrist ulnar deviation    Wrist radial deviation    Wrist pronation    Wrist supination    Grip strength (lbs)    (Blank rows = not tested)  SHOULDER SPECIAL TESTS: Impingement tests: Neer impingement test: positive  and Painful arc test: positive  SLAP lesions:  N/a Instability tests:  N/a Rotator cuff assessment: Drop arm test: negative, Empty can test: positive , Full can test: positive , Belly press test: positive , and Infraspinatus test: negative  Biceps assessment: Speed's test: positive   JOINT MOBILITY TESTING:  Not assessed   PALPATION:  Right bicipital groove ttp   TODAY'S TREATMENT:                                                                                                                                         DATE:   09/07/23: All single arm exercises RUE    UBE seat at 9 with  resistance at 3 at 2.5 min forward and 2.5 min backward  Shoulder MMT Flex R/L 4+/4+ Abd R/L 4+/4+ Lower Trap R/L 4-/4-   Shoulder ROM  -IR R/L L4-L5/T12  Progress shoulder home exercise progress with increase in load with next theraband.   08/17/23 All single arm exercises RUE  UBE seat at 9 with resistance at 3 at 2.5 min forward and 2.5 min backward  OMEGA Single Arm Row #5 lbs on OMEGA machine 2 x 10  Single Arm Row with yellow band 1 x 10  OMEGA Single Arm Lat Pull Down with #5 2 x 10 OMEGA Single Arm Lat Pull Down with yellow band 1 x 10 -min VC to maintain elbow extended for  LUE Shoulder Flexion Eccentric with yellow band 1 x 10  LUE Shoulder Flexion Eccentric with red band 1 x 10  LUE Shoulder Flexion Eccentric with yellow band 3 x 10   08/10/23: UBE seat at 9 with resistance at 3 for 5 min  OMEGA Seated Rows #25 1 x 10  OMEGA Seated Rows #35 2 x 10  OMEGA Lat Pull Downs #35 3 x 10  Bent Over Rows with #15  -Pt reports increased pain in posterior arm along tricep  Resisted Shoulder Extension on RLE  -Pt reports no increased pain  Bent Over Rows with #10 DB 3 x 10    Cervical AROM: -Flex 45 deg  -Ext 45 deg  -Rot R/L 60/60  -Side Bend R/L 30/25   Upper Trap Stretch 2 x 30 sec  -min VC for sequence of exercise. D2 PNF Flexion and Extension 2 x 10  D2 PNF Flexion and Extension with yellow band 2 x 10   08/06/23: Shoulder Flexion/Extension AAROM with pulleys 1 x 30  Shoulder Abduction/Adduction AAROM with pulleys 1 x 30  Eccentric Bicep Flexion on RUE with yellow band 1 x 10  -min VC to decrease speed of lowering arm    PATIENT EDUCATION: Education details: form and technique for correct performance of exercise.  Person educated: Patient Education method: Explanation, Demonstration, Verbal cues, and Handouts Education comprehension: verbalized understanding, returned demonstration, and verbal cues required  HOME EXERCISE PROGRAM: Access Code:  [email or web address] URL: [email or web address] Date: 09/07/2023 Prepared by: Ellin Goodie  Exercises - Seated Upper Trapezius Stretch  - 1 x daily - 3 reps - 30-60 sec hold - Standing Shoulder Internal Rotation Stretch with Towel  - 3-4 x weekly - 3 reps -  30 sec   hold - Standing Shoulder Internal Rotation AAROM with towel   - 3-4 x weekly - 3 sets - 10 reps - Standing Shoulder Flexion with Resistance  - 3-4 x weekly - 3 sets - 10 reps - Standing Single Arm Shoulder Abduction with Resistance  - 3-4 x weekly - 3 sets - 10 reps - Standing Shoulder Single Arm PNF D2 Flexion with Resistance  - 3-4 x weekly - 3 sets - 10 reps  ASSESSMENT:  CLINICAL IMPRESSION: Pt has now met most of his rehab goals with an improvement in his right shoulder strength, ROM, and improved perception of RUE function. He has requested to discharge due to financial concerns. Pt is now ready for discharge and he will continue to maintain functional UE gains.   OBJECTIVE IMPAIRMENTS: decreased strength, impaired UE functional use, and pain.   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   REHAB POTENTIAL: Good  CLINICAL DECISION MAKING: Stable/uncomplicated  EVALUATION COMPLEXITY: Low   GOALS: Goals reviewed with patient? No  SHORT TERM GOALS: Target date: 08/20/2023  PT reviewed the following HEP with patient with patient able to demonstrate a set of the following with min cuing for correction needed. PT educated patient on parameters of therex (how/when to inc/decrease intensity, frequency, rep/set range, stretch hold time, and purpose of therex) with verbalized understanding.  Baseline: NT  Goal status: ONGOING    LONG TERM GOALS: Target date: 10/15/2023  Patient will have improved  function and activity level as evidenced by an increase in FOTO score by 10 points or more.  Baseline: 55 with target of 72 09/07/23: 74 Goal status: ACHIEVED   2.  Patient will improve right shoulder strength to be near symmetrical to left shoulder for improved right shoulder function to return to playing pickle ball.  Baseline: Shoulder R/L Flex 4/4+, Abd 4/4+, Lower Trap 4-/4 09/07/23: Shoulder Flex R/L 4+/4+, Shoulder Abd R/L 4/4, Lower Trap R/L 3-/4-  Goal status: Partially Met    3.  Patient will improve right shoulder AROM to be near symmetrical to left shoulder for improved right shoulder function to return to playing pickle ball and completing yard work.  Baseline: Combined IR R/L Right ischial tuberosity/ L4-L5 vertebrae  Goal status: Partially Met    PLAN:  PT FREQUENCY: 1-2x/week  PT DURATION: 10 weeks  PLANNED INTERVENTIONS: Therapeutic exercises, Therapeutic activity, Self Care, Joint mobilization, Joint manipulation, Aquatic Therapy, Dry Needling, Electrical stimulation, Spinal manipulation, Spinal mobilization, Cryotherapy, Moist heat, Taping, Manual therapy, and Re-evaluation  PLAN FOR NEXT SESSION: Discharge from PT    Ellin Goodie PT, DPT  [facility] [address] [place], [address] Phone: [phone]   Fax:  [phone]

## 2023-09-10 ENCOUNTER — Ambulatory Visit: Payer: No Typology Code available for payment source | Admitting: Physical Therapy

## 2023-09-14 ENCOUNTER — Encounter: Payer: No Typology Code available for payment source | Admitting: Physical Therapy

## 2023-09-14 ENCOUNTER — Ambulatory Visit: Payer: No Typology Code available for payment source

## 2023-09-14 DIAGNOSIS — Z860101 Personal history of adenomatous and serrated colon polyps: Secondary | ICD-10-CM | POA: Diagnosis not present

## 2023-09-14 DIAGNOSIS — Z1211 Encounter for screening for malignant neoplasm of colon: Secondary | ICD-10-CM | POA: Diagnosis present

## 2023-09-14 DIAGNOSIS — K635 Polyp of colon: Secondary | ICD-10-CM | POA: Diagnosis not present

## 2023-09-14 DIAGNOSIS — K640 First degree hemorrhoids: Secondary | ICD-10-CM | POA: Diagnosis not present

## 2023-09-17 ENCOUNTER — Encounter: Payer: No Typology Code available for payment source | Admitting: Physical Therapy

## 2023-09-22 ENCOUNTER — Encounter: Payer: No Typology Code available for payment source | Admitting: Physical Therapy

## 2023-09-24 ENCOUNTER — Encounter: Payer: No Typology Code available for payment source | Admitting: Physical Therapy

## 2023-10-21 ENCOUNTER — Telehealth: Payer: Self-pay

## 2023-10-21 NOTE — Telephone Encounter (Signed)
Pt took liquid tylenol and that broke fever and pt was sweating. Pt did not go to UC or ED.10/21/23 pt still taking liquid tylenol and no fever.pt has not tested for covid or flu and declines appt at [facility] and pt said he would cb if needed.UC & ED precautions given and pt voiced understanding. Sending note to Dr Para March as PCP who is out of office and Dr Reece Agar who is in office.

## 2023-10-21 NOTE — Telephone Encounter (Signed)
Agree. Thanks

## 2023-10-22 ENCOUNTER — Encounter: Payer: Self-pay | Admitting: Family Medicine

## 2023-10-22 ENCOUNTER — Ambulatory Visit: Payer: No Typology Code available for payment source | Admitting: Internal Medicine

## 2023-10-22 VITALS — BP 114/70 | HR 79 | Temp 98.1°F | Ht 70.0 in | Wt 174.0 lb

## 2023-10-22 DIAGNOSIS — B349 Viral infection, unspecified: Secondary | ICD-10-CM | POA: Diagnosis not present

## 2023-10-22 LAB — POCT INFLUENZA A/B
Influenza A, POC: NEGATIVE
Influenza B, POC: NEGATIVE

## 2023-10-22 NOTE — Assessment & Plan Note (Addendum)
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

## 2023-10-22 NOTE — Addendum Note (Signed)
Addended by: Eual Fines on: 10/22/2023 09:43 AM   Modules accepted: Orders

## 2023-10-22 NOTE — Progress Notes (Signed)
Subjective:    Patient ID: Douglas Mathews, male    DOB: November 05, 1960, 62 y.o.   MRN: [id number]  HPI Here due to respiratory illness  Started 3 days ago while walking Some aching in feet/legs That night--woke feeling cold and had shakes Took ibuprofen 600mg ---broke fever with sweat Bad nights sleep--then woke at 6AM and still sick. Used tylenol suppositories (which is better for him then oral) Temp went up to 10.3.1 Tolerating liquid tylenol and it has helped the fever---but still with recurring chills, etc  Trouble with ibuprofen oral---tries to eat before  Some cough--not much Some headache --frontal No sig rhinorrhea or congestion Some thick drainage in throat--but no sore throat No ear pain No SOB  No COVID tests  Current Outpatient Medications on File Prior to Visit  Medication Sig Dispense Refill   clobetasol cream (TEMOVATE) 0.05 % Apply to area max once a day, once a week if possible 30 g 12   Investigational - Study Medication Take 100 mg by mouth daily. Study name: [id number] Additional study details: Medication name Dersimelagon     lisinopril (ZESTRIL) 10 MG tablet Take 1 tablet (10 mg total) by mouth daily. 90 tablet 3   PROMETHEGAN 25 MG suppository SMARTSIG:1 SUPPOS Rectally Every 4 Hours PRN 12 each 1   No current facility-administered medications on file prior to visit.    Allergies  Allergen Reactions   Tylenol [Acetaminophen]     GI intolerance with oral tylenol- nausea and vomiting with recurrent use    Past Medical History:  Diagnosis Date   GI bleed    due to bleeding polyp   History of MRI 03/03/2004   head, nml ([facility])   Hypertension    Rash    on buttock, respods to clobetasol    Past Surgical History:  Procedure Laterality Date   CEREBRAL ANEURYSM REPAIR  07/2018   comm aneurysm clipping [facility] at [facility] 2019   [facility])  06/07-06/07/2009   Anemia due to blood loss hypokalemia Erythr prophyria GI bleed from polyp   VASECTOMY   04/03/2000   [facility])    Family History  Problem Relation Age of Onset   Heart disease Father        Afib   Heart failure Father    ALS Maternal Grandmother    Stroke Maternal Grandfather    Colon cancer Maternal Uncle        possible hx   Prostate cancer Neg Hx     Social History   Socioeconomic History   Marital status: Married    Spouse name: Not on file   Number of children: 2   Years of education: Not on file   Highest education level: Bachelor's degree (e.g., BA, AB, BS)  Occupational History   Occupation: Engineer, site: SELF  Tobacco Use   Smoking status: Never   Smokeless tobacco: Never  Substance and Sexual Activity   Alcohol use: Yes    Alcohol/week: 10.0 standard drinks of alcohol    Types: 10 Cans of beer per week    Comment: 6 pack per week   Drug use: No   Sexual activity: Yes    Birth control/protection: Surgical  Other Topics Concern   Not on file  Social History Narrative   Divorced 2010   Remarried 2013   2 biological daughters, 2 step daughters, 4 grandchildren.     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   Social Drivers of Health   Financial Resource Strain:  Low Risk  (10/22/2023)   Overall Financial Resource Strain (CARDIA)    Difficulty of Paying Living Expenses: Not hard at all  Food Insecurity: No Food Insecurity (10/22/2023)   Hunger Vital Sign    Worried About Running Out of Food in the Last Year: Never true    Ran Out of Food in the Last Year: Never true  Transportation Needs: No Transportation Needs (10/22/2023)   PRAPARE - Administrator, Civil Service (Medical): No    Lack of Transportation (Non-Medical): No  Physical Activity: Insufficiently Active (10/22/2023)   Exercise Vital Sign    Days of Exercise per Week: 1 day    Minutes of Exercise per Session: 40 min  Stress: No Stress Concern Present (10/22/2023)   [employer] of Occupational Health - Occupational Stress Questionnaire    Feeling of Stress :  Only a little  Recent Concern: Stress - Stress Concern Present (08/03/2023)   [employer] of Occupational Health - Occupational Stress Questionnaire    Feeling of Stress : To some extent  Social Connections: Socially Integrated (10/22/2023)   Social Connection and Isolation Panel [NHANES]    Frequency of Communication with Friends and Family: Twice a week    Frequency of Social Gatherings with Friends and Family: Once a week    Attends Religious Services: More than 4 times per year    Active Member of [employer] or Organizations: Yes    Attends Engineer, structural: More than 4 times per year    Marital Status: Married  Catering manager Violence: Low Risk  (02/16/2020)   Received from [facility]   Intimate Partner Violence    Insults You: Not on file    Threatens You: Not on file    Screams at [place]: Not on file    Physically Hurt: Not on file    Intimate Partner Violence Score: Not on file   Review of Systems No loss of smell or taste No N/V Able to eat fine     Objective:   Physical Exam Constitutional:      Appearance: Normal appearance.  HENT:     Head:     Comments: No sinus tenderness    Right Ear: Tympanic membrane and ear canal normal.     Left Ear: Tympanic membrane and ear canal normal.     Mouth/Throat:     Pharynx: No oropharyngeal exudate or posterior oropharyngeal erythema.  Pulmonary:     Effort: Pulmonary effort is normal.     Breath sounds: Normal breath sounds. No wheezing or rales.  Abdominal:     Palpations: Abdomen is soft.     Tenderness: There is no abdominal tenderness.  Musculoskeletal:     Cervical back: Neck supple.  Lymphadenopathy:     Cervical: No cervical adenopathy.  Neurological:     Mental Status: He is alert.            Assessment & Plan:

## 2024-06-08 ENCOUNTER — Encounter: Payer: Self-pay | Admitting: Family Medicine

## 2024-06-08 ENCOUNTER — Other Ambulatory Visit: Payer: Self-pay | Admitting: Family Medicine

## 2024-06-08 NOTE — Telephone Encounter (Signed)
 Please call patient to schedule cpe. Send back to pcp pool so we can send refill if needed.

## 2024-06-09 NOTE — Telephone Encounter (Signed)
 Called pt and pt stated he will call the office and make appt when he gets back home

## 2024-07-07 ENCOUNTER — Other Ambulatory Visit: Payer: Self-pay

## 2024-07-07 ENCOUNTER — Encounter: Payer: Self-pay | Admitting: Family Medicine

## 2024-07-07 MED ORDER — LISINOPRIL 10 MG PO TABS: 10.0000 mg | ORAL_TABLET | Freq: Every day | ORAL | 0 refills | Status: DC

## 2024-07-18 ENCOUNTER — Ambulatory Visit (INDEPENDENT_AMBULATORY_CARE_PROVIDER_SITE_OTHER): Admitting: Family Medicine

## 2024-07-18 VITALS — BP 108/70 | HR 64 | Temp 97.7°F | Ht 70.0 in | Wt 175.0 lb

## 2024-07-18 DIAGNOSIS — Z659 Problem related to unspecified psychosocial circumstances: Secondary | ICD-10-CM

## 2024-07-18 DIAGNOSIS — Z23 Encounter for immunization: Secondary | ICD-10-CM

## 2024-07-18 DIAGNOSIS — Z7189 Other specified counseling: Secondary | ICD-10-CM

## 2024-07-18 DIAGNOSIS — I10 Essential (primary) hypertension: Secondary | ICD-10-CM

## 2024-07-18 DIAGNOSIS — E800 Hereditary erythropoietic porphyria: Secondary | ICD-10-CM

## 2024-07-18 DIAGNOSIS — I671 Cerebral aneurysm, nonruptured: Secondary | ICD-10-CM

## 2024-07-18 DIAGNOSIS — Z0000 Encounter for general adult medical examination without abnormal findings: Secondary | ICD-10-CM | POA: Diagnosis not present

## 2024-07-18 DIAGNOSIS — Z125 Encounter for screening for malignant neoplasm of prostate: Secondary | ICD-10-CM

## 2024-07-18 DIAGNOSIS — M5430 Sciatica, unspecified side: Secondary | ICD-10-CM

## 2024-07-18 MED ORDER — PREDNISONE 20 MG PO TABS: ORAL_TABLET | ORAL | 0 refills | Status: AC

## 2024-07-18 MED ORDER — LISINOPRIL 10 MG PO TABS: 10.0000 mg | ORAL_TABLET | Freq: Every day | ORAL | 3 refills | Status: AC

## 2024-07-18 NOTE — Progress Notes (Unsigned)
 CPE- See plan.  Routine anticipatory guidance given to patient.  See health maintenance.  The possibility exists that previously documented standard health maintenance information may have been brought forward from a previous encounter into this note.  If needed, that same information has been updated to reflect the current situation based on today's encounter.     Tetanus 2020 Flu 2025 PNA d/w pt.   Shingles prev done covid vaccine 2021 Colonoscopy 2024 Prostate cancer screening and PSA options (with potential risks and benefits of testing vs not testing) were discussed along with recent recs/guidelines.  He elects to keep testing PSA yearly.   Living will d/w pt. Wife designated if patient were incapacitated.  Diet and exercise. D/w pt. HCV neg 05/24/14 at [facility].  HIV screening prev done.     He is still on drug trial. D/w pt.    He isn't having HA, double vision, new neuro sx.   D/w pt about [facility] follow up in ~2026-2028.  Hypertension: Using medication without problems or lightheadedness: yes Chest pain with exertion:no Edema:no Short of breath:no Labs pending.    Other social stressors.  He had a merger at work in 2024.  Daughter is divorced.  Discussed.  He is able to manage as is.  He'll update me as needed.    Saw ortho about his shoulder.  Had done PT.  Then repeated PT with improvement.  He is back to playing pickleball.  Normal ROM R shoulder now.    He had lightning bolt sensation down the R leg.  Noted after playing pickleball.  Less sx during the day.  No L sided sx.  No weakness.  No numbness.  Sx going on for a few days.     PMH and SH reviewed   Meds, vitals, and allergies reviewed.    ROS: Per HPI.  Unless specifically indicated otherwise in HPI, the patient denies:   General: fever. Eyes: acute vision changes ENT: sore throat Cardiovascular: chest pain Respiratory: SOB GI: vomiting GU: dysuria Musculoskeletal: acute back pain Derm: acute rash Neuro: acute  motor dysfunction Psych: worsening mood Endocrine: polydipsia Heme: bleeding Allergy: hayfever   GEN: nad, alert and oriented HEENT: ncat NECK: supple w/o LA CV: rrr. PULM: ctab, no inc wob ABD: soft, +bs EXT: no edema SKIN: no acute rash S/S wnl BLE, SLR normal R leg.  Normal R hip ROM.

## 2024-07-18 NOTE — Patient Instructions (Addendum)
 Please check with [facility] next year.   Take care.  Glad to see you. Fasting lab visit when possible.  Flu shot today.  Use the back exercises and prednisone  if needed.

## 2024-07-20 DIAGNOSIS — M5430 Sciatica, unspecified side: Secondary | ICD-10-CM | POA: Insufficient documentation

## 2024-07-20 NOTE — Assessment & Plan Note (Signed)
 Other social stressors.  He had a merger at work in 2024.  Daughter is divorced.  Discussed.  He is able to manage as is.  He'll update me as needed.

## 2024-07-20 NOTE — Assessment & Plan Note (Signed)
 Discussed trial of prednisone  with routine steroid cautions discussed.  He can update me as needed.  Discussed stretching.  Okay for outpatient follow-up.  No weakness.

## 2024-07-20 NOTE — Assessment & Plan Note (Addendum)
 See notes on labs, when resulted.  Continue lisinopril .

## 2024-07-20 NOTE — Assessment & Plan Note (Signed)
 Living will d/w pt.  Wife designated if patient were incapacitated.   ?

## 2024-07-20 NOTE — Assessment & Plan Note (Signed)
 Flu 2025 PNA d/w pt.   Shingles prev done covid vaccine 2021 Colonoscopy 2024 Prostate cancer screening and PSA options (with potential risks and benefits of testing vs not testing) were discussed along with recent recs/guidelines.  He elects to keep testing PSA yearly.   Living will d/w pt. Wife designated if patient were incapacitated.  Diet and exercise. D/w pt. HCV neg 05/24/14 at [facility].  HIV screening prev done.

## 2024-07-20 NOTE — Assessment & Plan Note (Signed)
 He is still on drug trial. D/w pt. no new changes noted by patient.  I will defer.  He agrees.

## 2024-07-20 NOTE — Assessment & Plan Note (Signed)
 History of, status post clipping. He isn't having HA, double vision, new neuro sx.   D/w pt about [facility] follow up in ~2026-2028.

## 2024-07-29 ENCOUNTER — Other Ambulatory Visit

## 2024-08-08 ENCOUNTER — Other Ambulatory Visit: Payer: Self-pay | Admitting: Family Medicine

## 2024-11-07 ENCOUNTER — Encounter: Payer: Self-pay | Admitting: Family Medicine

## 2024-12-04 ENCOUNTER — Encounter: Payer: Self-pay | Admitting: Family Medicine
